# Patient Record
Sex: Female | Born: 1950 | Hispanic: No
Health system: Midwestern US, Academic
[De-identification: ages and names within clinical notes are randomized; demographics above are authoritative.]

## PROBLEM LIST (undated history)

## (undated) DIAGNOSIS — R1110 Vomiting, unspecified: Secondary | ICD-10-CM

## (undated) DIAGNOSIS — E119 Type 2 diabetes mellitus without complications: Secondary | ICD-10-CM

## (undated) DIAGNOSIS — M129 Arthropathy, unspecified: Secondary | ICD-10-CM

## (undated) DIAGNOSIS — I10 Essential (primary) hypertension: Secondary | ICD-10-CM

## (undated) DIAGNOSIS — IMO0001 Reserved for inherently not codable concepts without codable children: Secondary | ICD-10-CM

## (undated) DIAGNOSIS — L259 Unspecified contact dermatitis, unspecified cause: Secondary | ICD-10-CM

## (undated) DIAGNOSIS — Z973 Presence of spectacles and contact lenses: Secondary | ICD-10-CM

## (undated) DIAGNOSIS — M1990 Unspecified osteoarthritis, unspecified site: Secondary | ICD-10-CM

## (undated) DIAGNOSIS — I2510 Atherosclerotic heart disease of native coronary artery without angina pectoris: Secondary | ICD-10-CM

## (undated) HISTORY — PX: HX HYSTERECTOMY: SHX81

## (undated) HISTORY — PX: HX TONSILLECTOMY: SHX27

## (undated) HISTORY — PX: HX CARPAL TUNNEL RELEASE: SHX101

## (undated) HISTORY — PX: HX KNEE REPLACMENT: SHX125

## (undated) HISTORY — PX: HX HERNIA REPAIR: SHX51

## (undated) HISTORY — PX: CORONARY ARTERY ANGIOPLASTY: PR CATH30428

## (undated) HISTORY — PX: HX FOOT SURGERY: 2100001154

## (undated) HISTORY — PX: HX BACK SURGERY: SHX140

## (undated) HISTORY — PX: HX SHOULDER ARTHROSCOPY: SHX128

## (undated) HISTORY — PX: ABDOMINAL HYSTERECTOMY: SHX81

## (undated) HISTORY — PX: HERNIA REPAIR: SHX51

## (undated) HISTORY — PX: JOINT REPLACEMENT: SHX530

## (undated) HISTORY — PX: CARDIAC SURGERY: SHX584

---

## 1998-09-27 HISTORY — PX: CARPAL TUNNEL RELEASE: SHX101

## 1998-09-27 HISTORY — PX: REPLACEMENT TOTAL KNEE BILATERAL: SUR1225

## 2014-03-07 ENCOUNTER — Emergency Department (HOSPITAL_BASED_OUTPATIENT_CLINIC_OR_DEPARTMENT_OTHER)
Admission: EM | Admit: 2014-03-07 | Discharge: 2014-03-07 | Disposition: A | Discharge status: Home / Self Care | Payer: Self-pay | Attending: Emergency Medicine | Admitting: Emergency Medicine

## 2014-03-07 ENCOUNTER — Encounter (HOSPITAL_BASED_OUTPATIENT_CLINIC_OR_DEPARTMENT_OTHER): Payer: Self-pay

## 2014-03-07 DIAGNOSIS — E119 Type 2 diabetes mellitus without complications: Secondary | ICD-10-CM | POA: Insufficient documentation

## 2014-03-07 DIAGNOSIS — M5430 Sciatica, unspecified side: Secondary | ICD-10-CM | POA: Insufficient documentation

## 2014-03-07 DIAGNOSIS — I10 Essential (primary) hypertension: Secondary | ICD-10-CM | POA: Insufficient documentation

## 2014-03-07 DIAGNOSIS — G609 Hereditary and idiopathic neuropathy, unspecified: Secondary | ICD-10-CM | POA: Insufficient documentation

## 2014-03-07 HISTORY — DX: Type 2 diabetes mellitus without complications (CMS HCC): E11.9

## 2014-03-07 HISTORY — DX: Reserved for inherently not codable concepts without codable children: IMO0001

## 2014-03-07 HISTORY — DX: Essential (primary) hypertension: I10

## 2014-03-07 HISTORY — DX: Unspecified contact dermatitis, unspecified cause: L25.9

## 2014-03-07 HISTORY — DX: Presence of spectacles and contact lenses: Z97.3

## 2014-03-07 HISTORY — DX: Arthropathy, unspecified: M12.9

## 2014-03-07 MED ORDER — PREDNISONE 10 MG TABLET
ORAL_TABLET | ORAL | Status: AC
Start: 2014-03-07 — End: ?

## 2014-03-07 NOTE — ED Nurses Note (Signed)
Patient started with right hip & sciatica pain x 2 days ago. C/O numbness to right leg. Denies injury. History of back surgery x 10 years ago

## 2014-03-07 NOTE — ED Provider Notes (Signed)
Susan Sloan, P.A.  [facility]  Emergency Department Visit Note    Date: 03/07/2014  Primary care provider: None Given  Means of arrival: private car  History obtained by: patient  History limited by: none      Chief Complaint: sciatica    History of Present Illness     Susan Sloan, date of birth 09-Oct-1950, is a 63 y.o. female who presents to the Emergency Department complaining of low back pain.  The patient reports left hip and low back pain which has been going on for several months.  The patient states that she did sustain a fall approximately 3 months ago and since that time she has had right-sided low back pain that radiates across her buttock to the posterior aspect of her right knee.  The patient states that in that timeframe, she received a cortisone injection which temporarily helped her symptoms but now the symptoms have returned.  The patient states that she has had periodic numbness in the right thigh and leg.  This also has been a persistent symptom for many months.  The patient states she did have low back surgery that was over 10 years ago.  At that point in time, she states she had a" disk" problem.  The patient states that while she is a diabetic, that she monitors her glucose closely and states it seldom goes above 110 mg /dl.  The patient states that she has been taking Mobic alternating with ibuprofen for her hip pain; it is not helping at this point in time.  The patient did drive herself here.  The patient is not reporting any additional new onset symptoms.  There are no reports of any fever, no reports of any recent trauma.  No reports of her leg actually giving out and no reports of any increased extremity swelling.  In addition, she denies any increased thirst, hunger or urination.  The patient states that she has received steroid therapy in the past that has improved her condition and she is requesting that at this point in time.  The patient states she is well aware of the need  to monitor her blood glucose.    REMAINING REVIEW OF SYSTEMS:  No additional positive findings other than those listed in history of present illness, otherwise all other remaining review of systems have been discussed and are negative.  ROS: NO fever,chest pain , palpitations, dyspnea, abdominal pain, perceptible change in bowel or bladder pattern, no incontinence, no increased extremity swelling.      Context:  As Per HPI  Pertinent Past Medical History:  Reviewed  Onset:  As per HPI  Timing:  As per HPI  Location/Radiation: low back pain;radiating to right hip & leg   Quality:  As per HPI  Severity: 10   Modifying Factors:  As per HPI  Associated Symptoms:   Positive:  As per HPI  Negative:  As per HPI    Review of Systems     The pertinent positive and negative symptoms are as per HPI. All other systems reviewed and are negative.    Patient History      Past Medical History:  Past Medical History   Diagnosis Date   . Arthropathy, unspecified, site unspecified    . Diabetes mellitus    . Reflux    . HTN (hypertension)    . Contact dermatitis and other eczema, due to unspecified cause    . Wears glasses  Past Surgical History:  Past Surgical History   Procedure Laterality Date   . Coronary artery angioplasty     . Hx back surgery     . Hx knee replacment Bilateral    . Hx hysterectomy     . Hx carpal tunnel release     . Hx shoulder arthroscopy     . Hx hernia repair     . Hx cesarean section     . Hx tonsillectomy     . Hx foot surgery Left        Family History:  No family history on file.    Social History:  History   Substance Use Topics   . Smoking status: Never Smoker    . Smokeless tobacco: Not on file   . Alcohol Use: No     History   Drug Use No       Medications:  Previous Medications    GLIMEPIRIDE (AMARYL) 4 MG ORAL TABLET    Take 4 mg by mouth Every morning with breakfast    IBUPROFEN (MOTRIN) 800 MG ORAL TABLET    Take 800 mg by mouth Twice daily    ISOSORBIDE DINITRATE (ISORDIL) 30 MG ORAL  TABLET    Take 30 mg by mouth Three times a day    METFORMIN (GLUCOPHAGE) 1,000 MG ORAL TABLET    Take 1,000 mg by mouth Twice daily with food    METOPROLOL (LOPRESSOR) 50 MG ORAL TABLET    Take 50 mg by mouth Twice daily       Allergies:   Allergies   Allergen Reactions   . Codeine    . Morphine    . Penicillins    . Sulfa (Sulfonamides)        Physical Exam   PHYSICAL EXAMINATION: The patient is a markedly overnight elderly woman with very poor hygiene.  She is alert.  She is sitting on a stretcher reading a book and does not appear to be in any acute distress.    HEENT:  Head is normocephalic and atraumatic.  Eyes, conjunctivae pink, sclerae anicteric.  Pupils react to light.  Ears, tympanic membranes without hyperemia, no bulging or retraction.  Oral cavity, mucous membranes remain moist.  Posterior pharynx is without injection or asymmetry.    NECK:  Supple, no adenopathy, no nuchal rigidity.    HEART:  Regular rate and rhythm without murmur.    LUNGS:  No wheezing, no tachypnea.    ABDOMEN:  Obese, soft, nontender, difficult to assess for organomegaly secondary to body habitus.    EXTREMITIES:  Grips are symmetrical in the upper extremities.  She has full range of motion of the upper extremities with flexion, extension and abduction at the shoulders, elbows and wrists against resistance.  Lower extremities, proprioception of the great toes is equivocal.  Sometimes she is uncertain of the position great toe during assessment. When toe is placed in plantar flexion with her eyes closed she cannot determine the toes position on a consistent basis.  She did report gross sensation being intact and sharp sensation being intact.  The patient has symmetrical peripheral pulses.  Lower extremities with no pitting edema, no gastrocnemius pain.    The patient declined crutches.  The patient is declining x-rays.  The patient states she has a scheduled appointment with her regular provider within the month.  I did tell her  that it was imperative that she be reassessed regarding the findings suggestive of peripheral neuropathy.  She  should be very strict about monitoring her blood glucose and if it goes above 230, she should stop the steroids.  The patient verbally agrees to do so.    CONDITION UPON DISCHARGE:  While unchanged is considered stable.      Please note that the patient's posterior thorax, she has point tenderness over the sacroiliac joint on the right.  No skin lesions in the area and she has some point tenderness over the trochanter of the right hip.  No skin lesions in the area.  She has no tenderness directly over the spinous processes of the C-spine and sacrum and there is no costovertebral angle tenderness.        Vital Signs:  Filed Vitals:    03/07/14 0754   BP: 143/61   Pulse: 70   Resp: 16   SpO2: 98%              Diagnostics                Old records reviewed by me:        Pre-Disposition Vitals:  Filed Vitals:    03/07/14 0754   BP: 143/61   Pulse: 70   Resp: 16   SpO2: 98%       Clinical Impression    1.Acute Sciatica:Right  2. Peripheral Neuropathy  3. Diabetes Type II    Plan/Disposition     Discharged    Prescriptions:  New Prescriptions    PREDNISONE (DELTASONE) 10 MG ORAL TABLET    Disp # 15 tabs  Sig : 50 mg day one tapered to 10 mg by day five (take this with food)       Follow Up:  Given, None  [address]  [facility] [place] [id number]          Casimiro NeedleMorris, Samuel David, MD  [address]  [place] [id number]  [phone]    Schedule an appointment as soon as possible for a visit in 2 days          Condition on Disposition: stable

## 2014-03-07 NOTE — ED Nurses Note (Signed)
Patient receives cortisone shots routinely to right hip. Patient from [place], visiting daughter.

## 2014-03-07 NOTE — ED Nurses Note (Signed)
Denise PA in room at present

## 2014-03-07 NOTE — ED Nurses Note (Signed)
Patient discharged home with family.  AVS reviewed with patient/care giver.  A written copy of the AVS and discharge instructions was given to the patient.  Questions sufficiently answered as needed.  Patient encouraged to follow up with PCP as indicated.  In the event of an emergency, patient instructed to call 911 or go to the nearest emergency room. Prescription for Prednisone given, patient verbalized understanding

## 2014-05-18 ENCOUNTER — Emergency Department (HOSPITAL_COMMUNITY)
Admission: EM | Admit: 2014-05-18 | Discharge: 2014-05-19 | Disposition: A | Discharge status: Home / Self Care | Payer: Self-pay | Attending: Emergency Medicine | Admitting: Emergency Medicine

## 2014-05-18 ENCOUNTER — Encounter (HOSPITAL_COMMUNITY): Payer: Self-pay | Admitting: Emergency Medicine

## 2014-05-18 ENCOUNTER — Emergency Department (HOSPITAL_COMMUNITY): Payer: Self-pay

## 2014-05-18 DIAGNOSIS — M5431 Sciatica, right side: Secondary | ICD-10-CM

## 2014-05-18 DIAGNOSIS — M545 Low back pain, unspecified: Secondary | ICD-10-CM | POA: Insufficient documentation

## 2014-05-18 DIAGNOSIS — M1610 Unilateral primary osteoarthritis, unspecified hip: Secondary | ICD-10-CM | POA: Insufficient documentation

## 2014-05-18 DIAGNOSIS — I2510 Atherosclerotic heart disease of native coronary artery without angina pectoris: Secondary | ICD-10-CM | POA: Insufficient documentation

## 2014-05-18 DIAGNOSIS — Z9889 Other specified postprocedural states: Secondary | ICD-10-CM | POA: Insufficient documentation

## 2014-05-18 DIAGNOSIS — Z96659 Presence of unspecified artificial knee joint: Secondary | ICD-10-CM | POA: Insufficient documentation

## 2014-05-18 DIAGNOSIS — M79609 Pain in unspecified limb: Secondary | ICD-10-CM | POA: Insufficient documentation

## 2014-05-18 DIAGNOSIS — M25559 Pain in unspecified hip: Secondary | ICD-10-CM | POA: Insufficient documentation

## 2014-05-18 DIAGNOSIS — Z880 Allergy status to penicillin: Secondary | ICD-10-CM | POA: Insufficient documentation

## 2014-05-18 DIAGNOSIS — M1611 Unilateral primary osteoarthritis, right hip: Secondary | ICD-10-CM

## 2014-05-18 DIAGNOSIS — E119 Type 2 diabetes mellitus without complications: Secondary | ICD-10-CM | POA: Insufficient documentation

## 2014-05-18 HISTORY — DX: Atherosclerotic heart disease of native coronary artery without angina pectoris: I25.10

## 2014-05-18 HISTORY — DX: Unspecified osteoarthritis, unspecified site: M19.90

## 2014-05-18 HISTORY — DX: Type 2 diabetes mellitus without complications: E11.9

## 2014-05-18 LAB — CBC WITH DIFFERENTIAL/PLATELET
BASOS ABS: 0 10*3/uL (ref 0.0–0.1)
BASOS PCT: 0 % (ref 0–1)
Eosinophils Absolute: 0.2 10*3/uL (ref 0.0–0.7)
Eosinophils Relative: 2 % (ref 0–5)
HEMATOCRIT: 38.3 % (ref 36.0–46.0)
HEMOGLOBIN: 12.4 g/dL (ref 12.0–15.0)
Lymphocytes Relative: 24 % (ref 12–46)
Lymphs Abs: 2.8 10*3/uL (ref 0.7–4.0)
MCH: 28.3 pg (ref 26.0–34.0)
MCHC: 32.4 g/dL (ref 30.0–36.0)
MCV: 87.4 fL (ref 78.0–100.0)
MONO ABS: 0.6 10*3/uL (ref 0.1–1.0)
Monocytes Relative: 5 % (ref 3–12)
NEUTROS ABS: 7.9 10*3/uL — AB (ref 1.7–7.7)
Neutrophils Relative %: 69 % (ref 43–77)
Platelets: 249 10*3/uL (ref 150–400)
RBC: 4.38 MIL/uL (ref 3.87–5.11)
RDW: 14.4 % (ref 11.5–15.5)
WBC: 11.6 10*3/uL — ABNORMAL HIGH (ref 4.0–10.5)

## 2014-05-18 LAB — I-STAT CHEM 8, ED
BUN: 14 mg/dL (ref 6–23)
Calcium, Ion: 1.14 mmol/L (ref 1.13–1.30)
Chloride: 103 mEq/L (ref 96–112)
Creatinine, Ser: 1.2 mg/dL — ABNORMAL HIGH (ref 0.50–1.10)
GLUCOSE: 175 mg/dL — AB (ref 70–99)
HEMATOCRIT: 40 % (ref 36.0–46.0)
Hemoglobin: 13.6 g/dL (ref 12.0–15.0)
Potassium: 4.4 mEq/L (ref 3.7–5.3)
Sodium: 133 mEq/L — ABNORMAL LOW (ref 137–147)
TCO2: 30 mmol/L (ref 0–100)

## 2014-05-18 MED ORDER — TRAMADOL HCL 50 MG PO TABS
50.0000 mg | ORAL_TABLET | Freq: Once | ORAL | Status: AC
Start: 2014-05-18 — End: 2014-05-19
  Administered 2014-05-19: 50 mg via ORAL
  Filled 2014-05-18: qty 1

## 2014-05-18 MED ORDER — PREDNISONE 20 MG PO TABS
60.0000 mg | ORAL_TABLET | Freq: Once | ORAL | Status: AC
  Administered 2014-05-19: 60 mg via ORAL
  Filled 2014-05-18: qty 3

## 2014-05-18 NOTE — ED Provider Notes (Signed)
CSN: [id number]     Arrival date & time 05/18/14  2016 History   First MD Initiated Contact with Patient 05/18/14 2326     Chief Complaint  Patient presents with  . Hip Pain  . Leg Pain     (Consider location/radiation/quality/duration/timing/severity/associated sxs/prior Treatment) HPI Elderly obese woman with history of lumbar laminectomy and sciatica on the right side. She comes in with complaints of pain in the right hip and knee. She has had this pain for several months. She says that pain is getting worse over the past week.  A relative told her that she needed to come to the ED to get checked out. She sometimes experiences numbness in the right thigh when she lays for a long time against her right hip.   Patient does not have an orthopedist or a PCP. 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    Past Medical History  Diagnosis Date  . Diabetes mellitus without complication   . Coronary artery disease   . Arthritis    Past Surgical History  Procedure Laterality Date  . Cardiac surgery      triple bypass  . Abdominal hysterectomy    . Replacement total knee bilateral  2000  . Carpal tunnel release  2000  . Hernia repair    . Joint replacement    . Cesarean section     History reviewed. No pertinent family history. History  Substance Use Topics  . Smoking status: Never Smoker   . Smokeless tobacco: Not on file  . Alcohol Use: No   OB History   Grav Para Term Preterm Abortions TAB SAB Ect Mult Living                 Review of Systems  10 point review of symptoms obtained and is negative with the exceptions of symptoms noted abov.e   Allergies  Betadine; Codeine; Demerol; Morphine and related; Penicillins; Sulfa antibiotics; and Tape  Home Medications   Prior to Admission medications   Not on File   BP 119/73  Pulse 95  Temp(Src) 98 F (36.7 C) (Oral)  Resp 18  SpO2 100% Physical  Exam  Gen: well nourished and well developed appearing Head: NCAT Ears: normal to inspection Nose: normal to inspection, no epistaxis or drainage Mouth: oral mucsoa is well hydrated appearing, normal posterior oropharynx Neck: supple, no stridor CV: RRR, no murmur, palpable peripheral pulses Resp: lung sounds are clear to auscultation bilaterally, no wheeing or rhonchi or rales, normal respiratory effort.  Abd: soft, nontender, nondistended, obese,  Extremities: ttp over the right hip with normal passive ROM, well healed incision over the right knee with passive ROM in place, no ttp over the remainder of the leg. DP pulses symmetric, sensation intact to light touch throughout, brisk and symmetric patellar and achilles tendon reflexes.Skin: warm and dry Neuro: CN ii - XII, no focal deficitis Psyche; normal affect, cooperative.   ED Course  Procedures (including critical care time) Labs Review  Results for orders placed during the hospital encounter of 05/18/14 (from the past 24 hour(s))  CBC WITH DIFFERENTIAL     Status: Abnormal   Collection Time    05/18/14 10:01 PM      Result Value Ref Range   WBC 11.6 (*) 4.0 - 10.5 K/uL   RBC 4.38  3.87 - 5.11 MIL/uL   Hemoglobin 12.4  12.0 - 15.0  g/dL   HCT 16.1  09.6 - 04.5 %   MCV 87.4  78.0 - 100.0 fL   MCH 28.3  26.0 - 34.0 pg   MCHC 32.4  30.0 - 36.0 g/dL   RDW 40.9  81.1 - 91.4 %   Platelets 249  150 - 400 K/uL   Neutrophils Relative % 69  43 - 77 %   Neutro Abs 7.9 (*) 1.7 - 7.7 K/uL   Lymphocytes Relative 24  12 - 46 %   Lymphs Abs 2.8  0.7 - 4.0 K/uL   Monocytes Relative 5  3 - 12 %   Monocytes Absolute 0.6  0.1 - 1.0 K/uL   Eosinophils Relative 2  0 - 5 %   Eosinophils Absolute 0.2  0.0 - 0.7 K/uL   Basophils Relative 0  0 - 1 %   Basophils Absolute 0.0  0.0 - 0.1 K/uL  I-STAT CHEM 8, ED     Status: Abnormal   Collection Time    05/18/14 10:13 PM      Result Value Ref Range   Sodium 133 (*) 137 - 147 mEq/L   Potassium  4.4  3.7 - 5.3 mEq/L   Chloride 103  96 - 112 mEq/L   BUN 14  6 - 23 mg/dL   Creatinine, Ser 7.82 (*) 0.50 - 1.10 mg/dL   Glucose, Bld 956 (*) 70 - 99 mg/dL   Calcium, Ion 2.13  0.86 - 1.30 mmol/L   TCO2 30  0 - 100 mmol/L   Hemoglobin 13.6  12.0 - 15.0 g/dL   HCT 57.8  46.9 - 62.9 %   DG Hip Complete Right (Final result)  Result time: 05/18/14 23:54:00    Final result by Rad Results In Interface (05/18/14 23:54:00)    Narrative:   CLINICAL DATA: 63 year old female right hip pain, leg pain. Initial encounter.  EXAM: RIGHT HIP - COMPLETE 2+ VIEW  COMPARISON: None.  FINDINGS: Right hip joint space loss and subchondral sclerosis. Both femoral heads remain normally located. Bony pelvis intact. Sacrum and SI joints within normal limits. Lower lumbar disc space loss and vacuum disc phenomena.  Proximal right femur intact.  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 No acute fracture or dislocation identified about the right hip or pelvis.   Electronically Signed By: Augusto Gamble M.D. On: 05/18/2014 23:54             DG Knee 1-2 Views Right (Final result)  Result time: 05/18/14 23:55:54    Procedure changed from [facility] Knee Complete 4 Views Right       Final result by Rad Results In Interface (05/18/14 23:55:54)    Narrative:   CLINICAL DATA: Knee pain.  EXAM: RIGHT KNEE - 1-2 VIEW  COMPARISON: None.  FINDINGS: Total knee arthroplasty. No acute fracture or hardware displacement. There is a lucency around the tibial component, up to 3 mm. This finding is likely postsurgical but indeterminate without comparison. No definitive joint effusion.  IMPRESSION: 1. No acute osseous findings. 2. Total knee arthroplasty with lucency around the tibial component. Recommend orthopedic follow-up for correlation with previous studies to exclude chronic loosening.   Electronically Signed By: Tiburcio Pea M.D. On: 05/18/2014 23:55          MDM    Patient with symptoms of sciatica and history of same. Reports improvement in sciatica, previously with steroid burst and requests same. First dose of prednisone given in the ED. Patient informed of xray findings including severe DJD  of the right hip which is her likely cause of right hip pain. She [place] her dtr. Home on Tramadol and Tylenol.    Brandt LoosenJulie Manly, MD 05/19/14 [phone]

## 2014-05-18 NOTE — ED Notes (Signed)
Patient states she was walking and she states that her right leg nearly gave out and she almost fell.  She does walk with a cane.  Patient states she has had trouble with the right hip lately going numb.  Patient states she does have a headache also.  Patient is CAOx3.  Patient states she also has a rash on her arms and legs that itch.

## 2014-05-19 MED ORDER — METHYLPREDNISOLONE 4 MG PO KIT: PACK | ORAL | Status: AC

## 2014-05-19 MED ORDER — TRAMADOL HCL 50 MG PO TABS: 50.0000 mg | ORAL_TABLET | Freq: Four times a day (QID) | ORAL | Status: AC | PRN

## 2014-05-19 NOTE — Discharge Instructions (Signed)
Arthritis, Nonspecific °Arthritis is pain, redness, warmth, or puffiness (inflammation) of a joint. The joint may be stiff or hurt when you move it. One or more joints may be affected. There are many types of arthritis. Your doctor may not know what type you have right away. The most common cause of arthritis is wear and tear on the joint (osteoarthritis). °HOME CARE  °· Only take medicine as told by your doctor. °· Rest the joint as much as possible. °· Raise (elevate) your joint if it is puffy. °· Use crutches if the painful joint is in your leg. °· Drink enough fluids to keep your pee (urine) clear or pale yellow. °· Follow your doctor's diet instructions. °· Use cold packs for very bad joint pain for 10 to 15 minutes every hour. Ask your doctor if it is okay for you to use hot packs. °· Exercise as told by your doctor. °· Take a warm shower if you have stiffness in the morning. °· Move your sore joints throughout the day. °GET HELP RIGHT AWAY IF:  °· You have a fever. °· You have very bad joint pain, puffiness, or redness. °· You have many joints that are painful and puffy. °· You are not getting better with treatment. °· You have very bad back pain or leg weakness. °· You cannot control when you poop (bowel movement) or pee (urinate). °· You do not feel better in 24 hours or are getting worse. °· You are having side effects from your medicine. °MAKE SURE YOU:  °· Understand these instructions. °· Will watch your condition. °· Will get help right away if you are not doing well or get worse. °Document Released: 12/08/2009 Document Revised: 03/14/2012 Document Reviewed: 12/08/2009 °Exit[employer]® Patient Information ©2015 [employer]. This information is not intended to replace advice given to you by your health care provider. Make sure you discuss any questions you have with your health care provider. ° °

## 2015-05-15 IMAGING — CR DG HIP COMPLETE 2+V*R*
3 series · 3 of 3 positions shown · non-contrast
Comparison: None.

CLINICAL DATA: 63-year-old female right hip pain, leg pain. Initial
encounter.

EXAM:
RIGHT HIP - COMPLETE 2+ VIEW

[t pelvis ap]
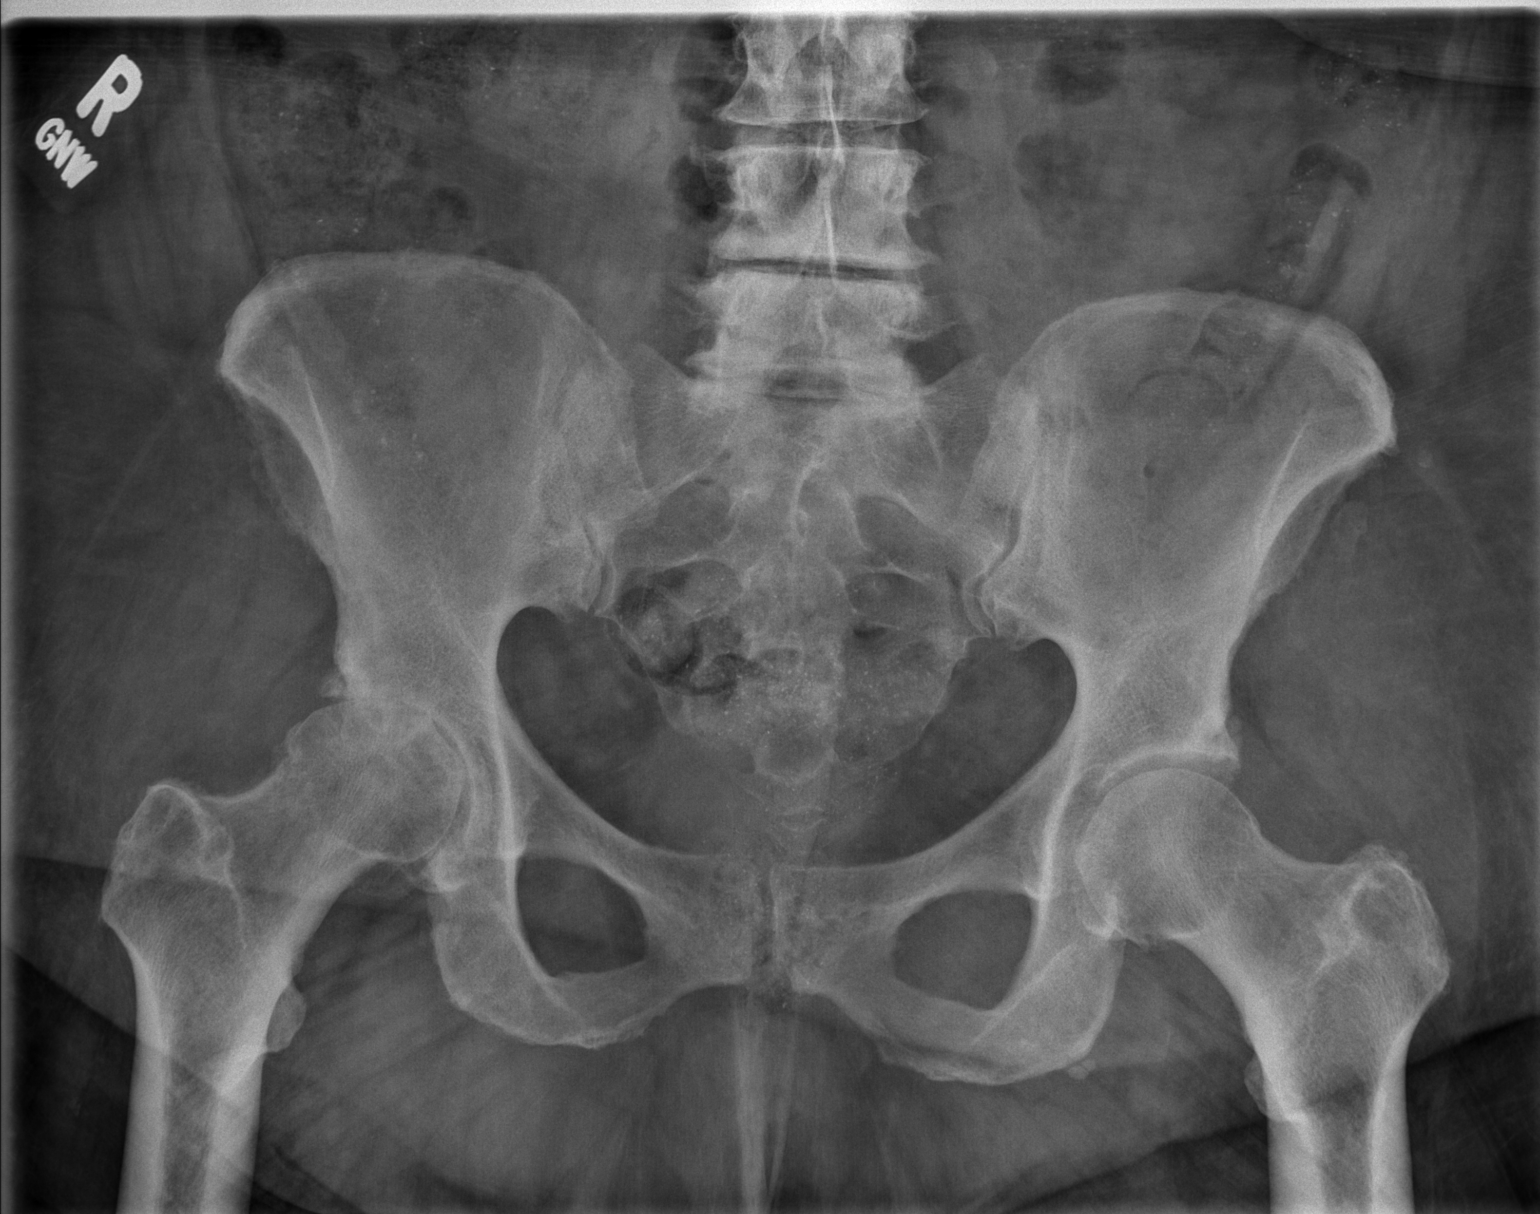

[t hip ap right]
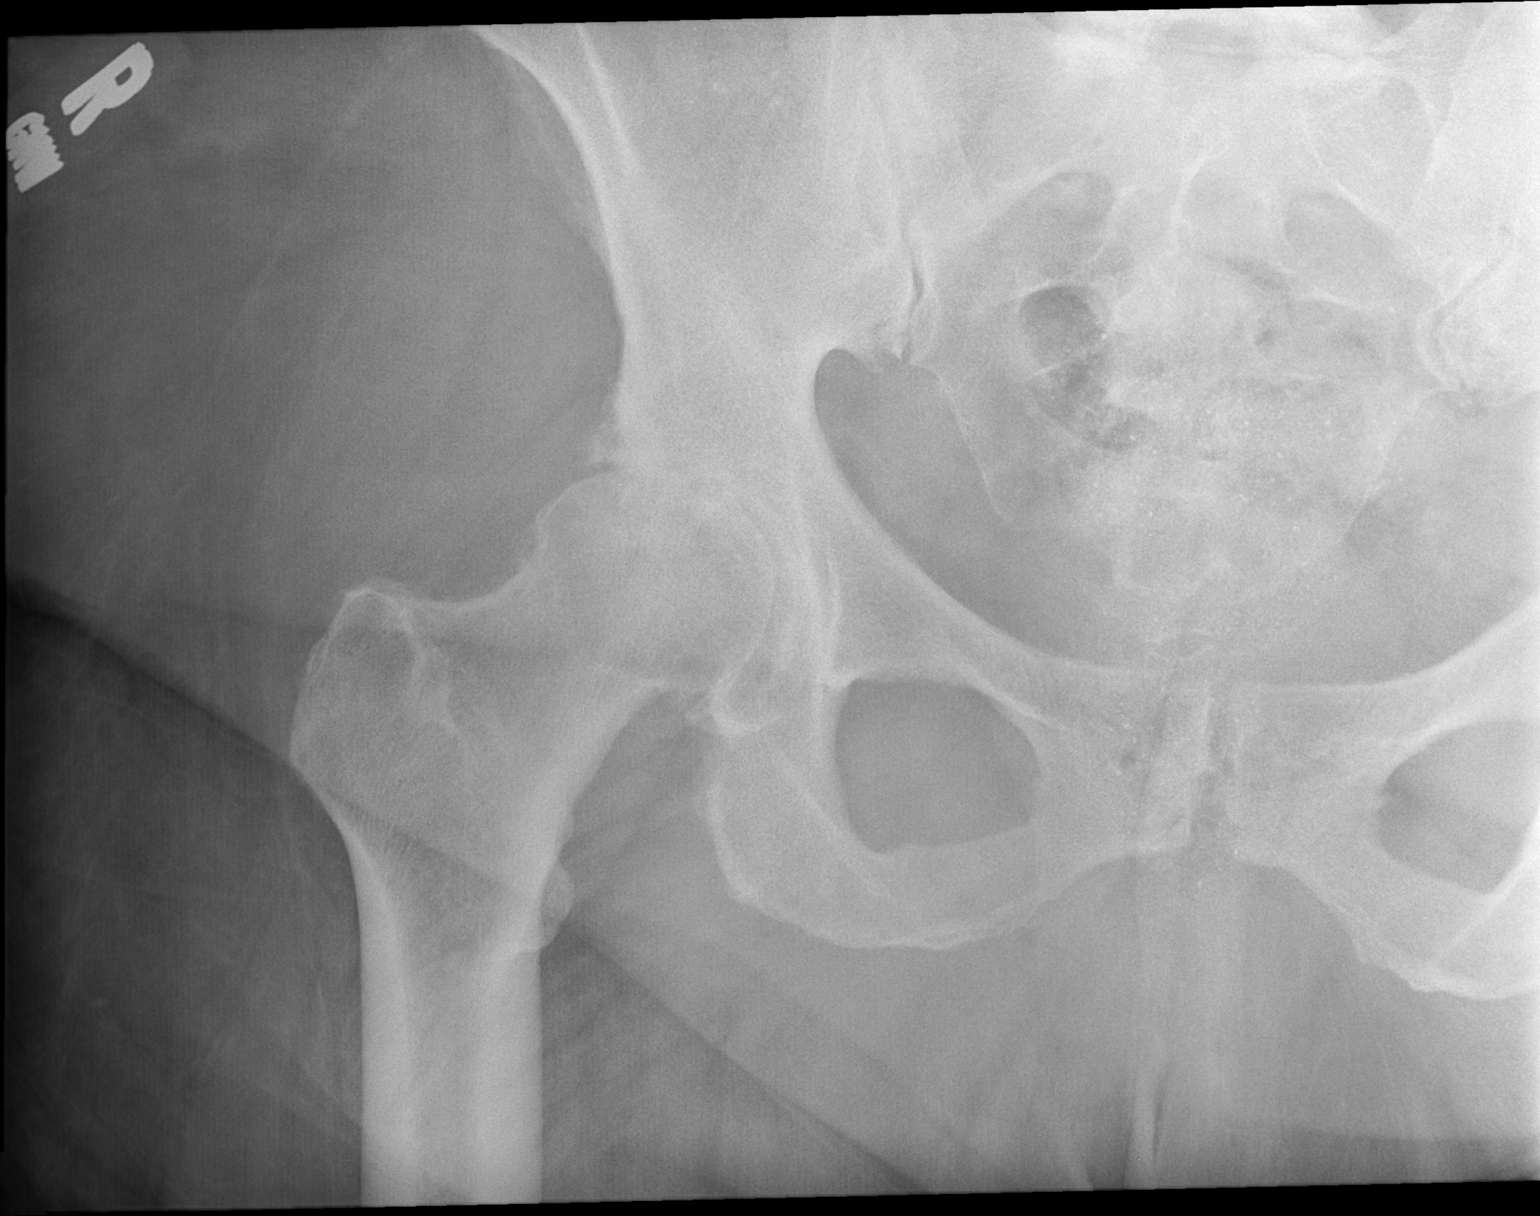

[t hip frog leg right]
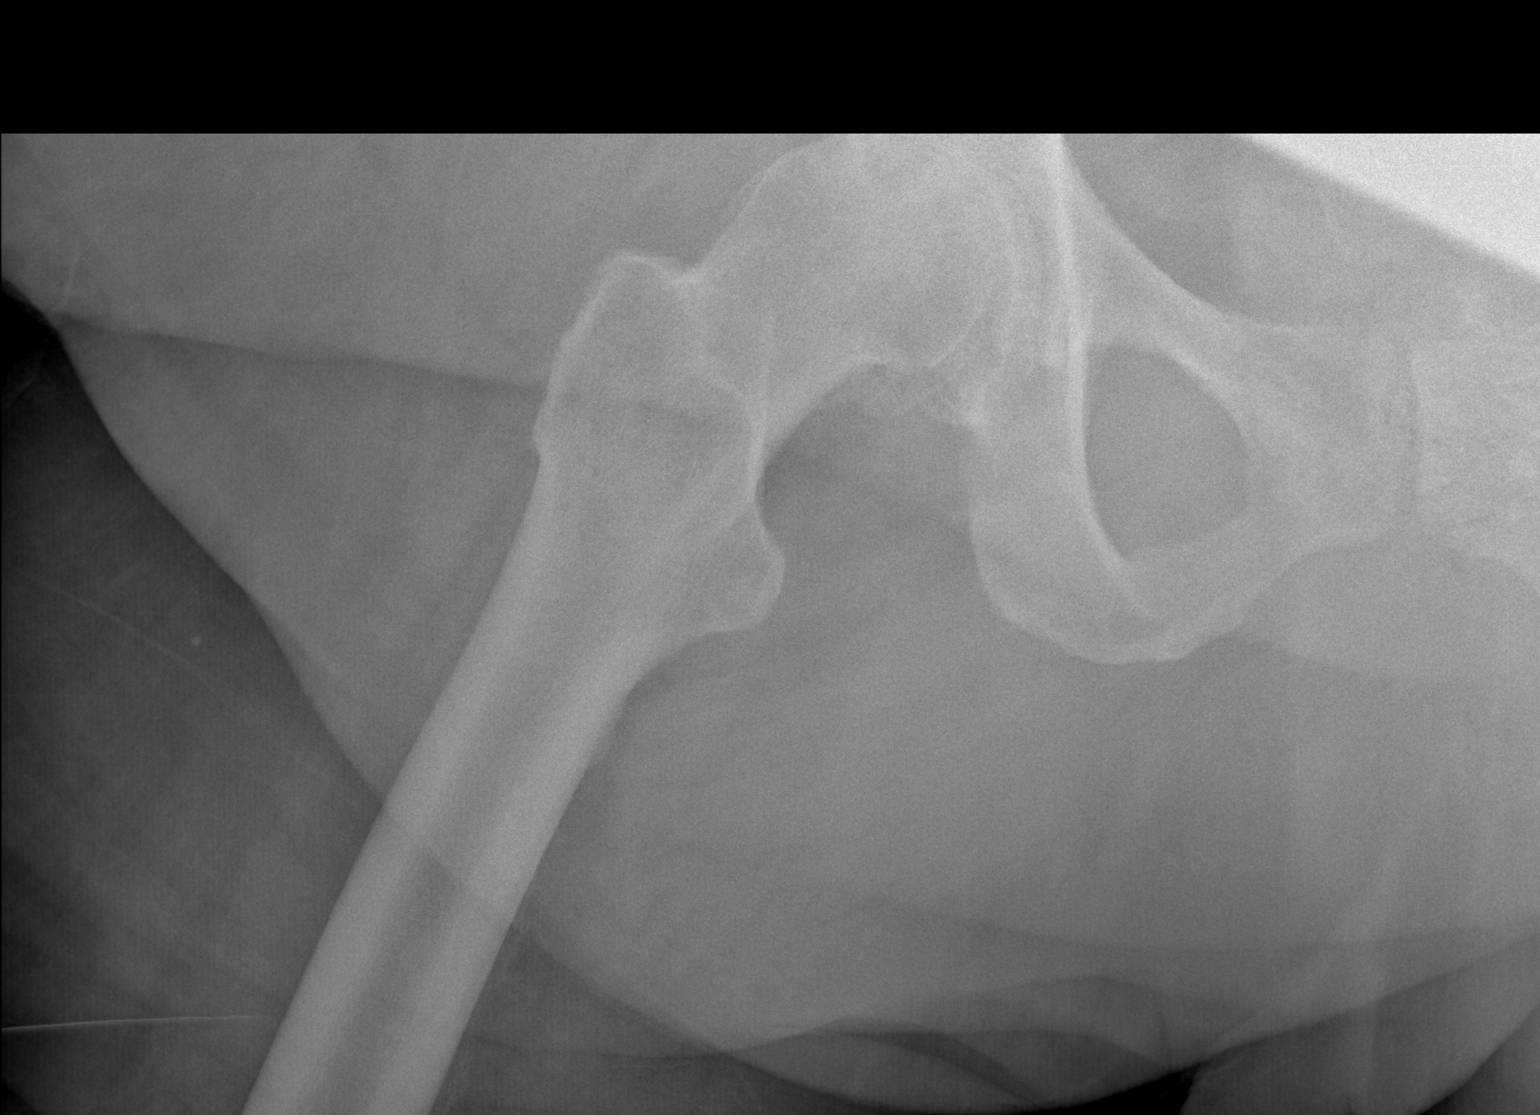

[3 of 3 positions shown; findings below may reference images not displayed]

FINDINGS: Right hip joint space loss and subchondral sclerosis. Both femoral
heads remain normally located. Bony pelvis intact. Sacrum and SI
joints within normal limits. Lower lumbar disc space loss and vacuum
disc phenomena.

Proximal right femur intact.
IMPRESSION: Moderate to severe right hip osteoarthritis. No acute fracture or
dislocation identified about the right hip or pelvis.

## 2015-05-15 IMAGING — CR DG KNEE 1-2V*R*
2 series · 2 of 2 positions shown · non-contrast
Comparison: None.

CLINICAL DATA: Knee pain.

EXAM:
RIGHT KNEE - 1-2 VIEW

[t knee ap right]
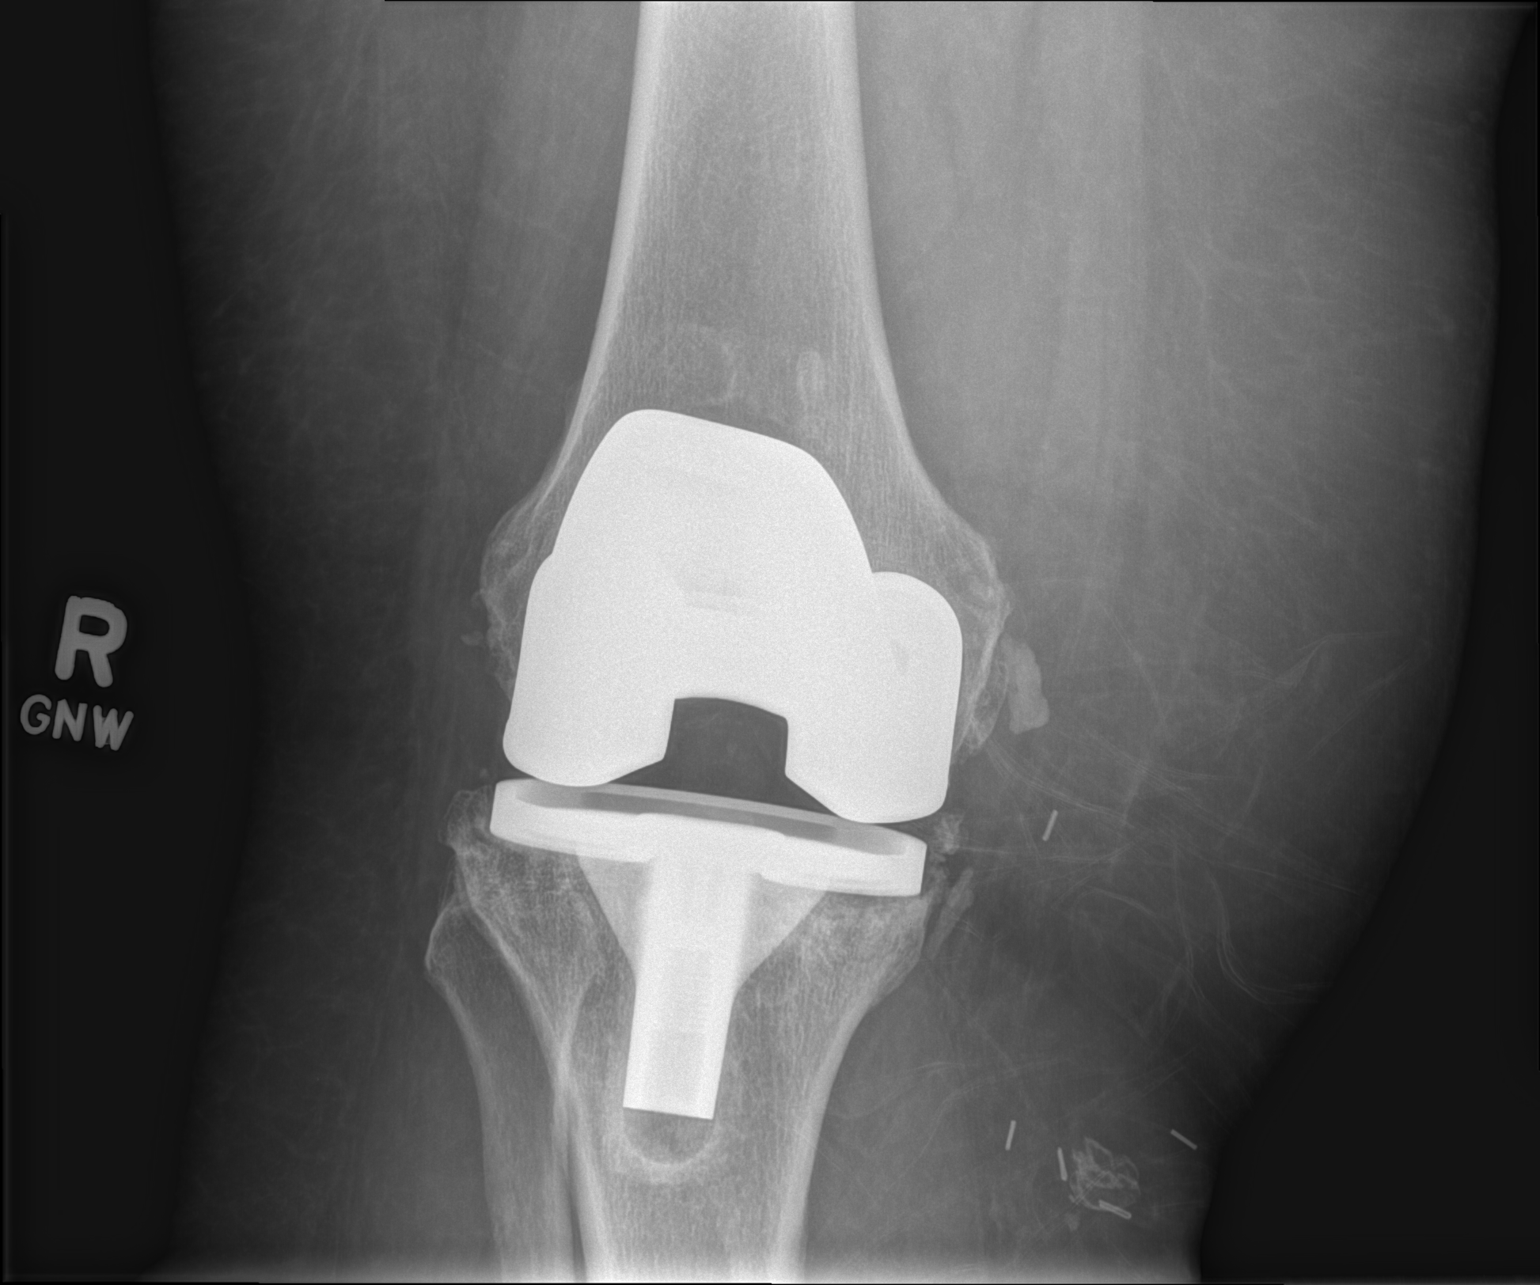

[t knee lat right]
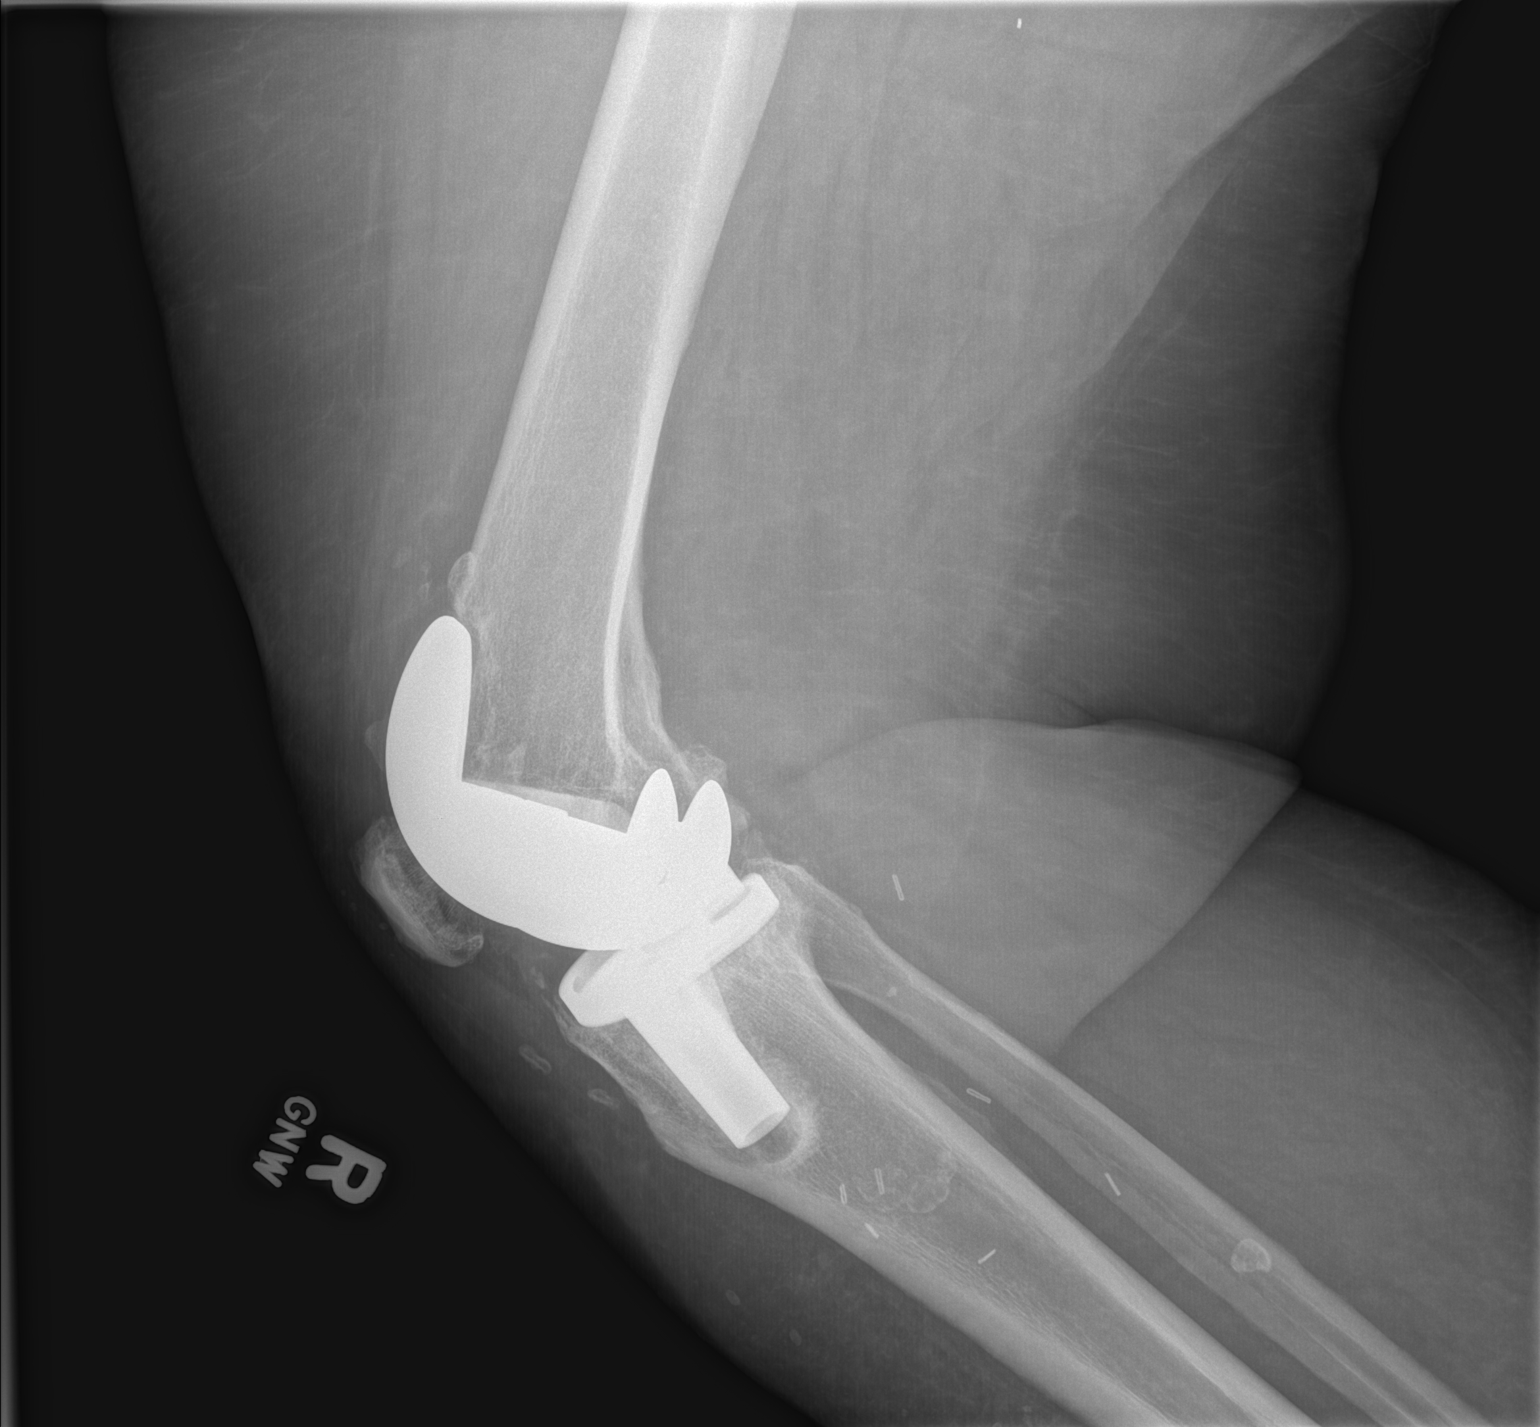

[2 of 2 positions shown; findings below may reference images not displayed]

FINDINGS: Total knee arthroplasty. No acute fracture or hardware displacement.
There is a lucency around the tibial component, up to 3 mm. This
finding is likely postsurgical but indeterminate without comparison.
No definitive joint effusion.
IMPRESSION: 1. No acute osseous findings.
2. Total knee arthroplasty with lucency around the tibial component.
Recommend orthopedic follow-up for correlation with previous studies
to exclude chronic loosening.

## 2015-12-19 ENCOUNTER — Inpatient Hospital Stay: Admit: 2015-12-19 | Attending: Internal Medicine

## 2015-12-19 LAB — POCT GLUCOSE
POC Glucose: 78 mg/dl (ref 70–99)
POC Glucose: 74 mg/dl (ref 70–99)

## 2015-12-19 MED ORDER — MIDAZOLAM HCL 5 MG/5ML IJ SOLN
5 | INTRAMUSCULAR | Status: AC
Start: 2015-12-19 — End: 2015-12-19

## 2015-12-19 MED ORDER — MEPERIDINE HCL 50 MG/ML IJ SOLN
50 | INTRAMUSCULAR | Status: AC
Start: 2015-12-19 — End: 2015-12-19

## 2015-12-19 MED FILL — DEMEROL 50 MG/ML IJ SOLN: 50 MG/ML | INTRAMUSCULAR | Qty: 1

## 2015-12-19 MED FILL — MIDAZOLAM HCL 5 MG/5ML IJ SOLN: 5 MG/ML | INTRAMUSCULAR | Qty: 10

## 2015-12-19 NOTE — Plan of Care (Addendum)
ADMISSION PRE-PROCEDURE INTRA-PROCEDURE POST-PROCEDURE: RECOVERY/ DISCHARGE   ASSESSMENT &  EVALUATION CONSULTS [x] Verify patient identification, allergies, vital signs, NPO, IV, & SPO2  [x] Complete  the ALDRETE SCORE  [x] Consent form to treat signed  [x] History and Physical [x] Reassess patient after pre- procedure medication given  [x] GI physician evaluates pt  [x] Verify patient's name, allergies [x] Continuous monotoring of vital  signs, SPO2, LOC  [x] Emotional status  [x] Patient comfort level [x] Total system admission assessment with appropriate intervention  [x] Pain evaluation and management  [x] Discharge assessment with appropriate intervention  [x] Compare with pre-procedure status  [x] Discharge by appropriate physician   DIAGNOSTIC / TESTS [x] Lab work ordered  [x] Obtain and attach lab work to patient's chart  [x] Report abnormals and F/U with physician [x] Assure needed test results are present [x] Diagnostic/therapeutic testing as indicated  [x] Obtained specimens sent to lab [x] Diagnostic testing as indicated  [x] Assess the gag reflex   MEDICATIONS [x] Conscious sedation medications  explained to patient  [x] Start IV per physician's order  and/or protocol  [x] Pre-procedure med. as ordered [x] Re-check IV access [x] Assist with administration of IV conscious sedation medication  [x] Start O2 per  nasal cannula, if needed   [x] IV fluids as indicated/ordered  [x] Administration of medications as ordered  [x] Medications as prescribed  [x] D/C O2 therapy as ordered   PROCEDURE/TREATMENT [x] Specific order by GI physician  [x] Specific procedure as scheduled  [x] Verify procedure as ordered [x] Time out/procedure verification checklist complete. [x] EGD and related procedures  [x] Assist physician with the procedure [x] Treatment as indicated   NUTRITION / DIET [x] NPO after midnight, as ordered [x] Verify NPO status [x] IV fluids as support [x] Clear liquids and/or ice chips or special diet  as ordered  [x] Tolerating clear liquids  [x] D/C IV fluids   ACTIVITY [x] Assess level of function  [x] Specified by physician  [x]Activity as tolerated [x] Position on left side [x] Position on left side and reposition patient as physician ordered [x] Gradually elevate HOB to fowler???s position  [x] Position changes as patient tolerated  [x] Ambulate as pre-procedure   PATIENT / SO EDUCATION [x] Pre,Intra, Post-procedure  teaching appropriate to procedure  [x]Conscious Sedation Teaching  [x] Pain Management - instructed [x] Encourage questions  [x] Clarify any concerns [x]Assist and support patient  [x]Safety devices maintain to  prevent patient injury  [x] Observe standard precautions [x] Physician confers with the family/S.O.  [x] Short visit from family in [place] area  [x] Review discharge instructions, take home medicine to family /S.O.; questions clarified  [x] Physician specific post-procedure orders  [x] S/S complications with proper F/U  [x] F/U office visits  [] Med info sheet/ copy of discharge  instruction given to pt./S.O.   OUTCOME PLANNING  DISCHARGE PLAN [x] Patient/S.O. will verbalize understanding of admission  procedure & expectations of outcome in realistic terms  [x] Patient verbalize the role of family/S.O. in plan of care  [x] Patient will have designated responsible person available for discharge. [x] Patient will demonstrate an  understanding of the planned  procedure and its related procedures and conscious sedation [x] Patient will:  - receive services according to the standards of care  - receive standards for conscious sedation  -  remain free of injury [x] Patient will:   - have stable vital signs based on Aldrete Score  -    be pain free or tolerable, have no signs of difficulty in breathing  - no abdominal distention, severe sore throat, chest pain, bleeding  -  return to pre-procedure level of conciousness   - tolerate fluid with no N/V  - ambulate as pre-procedure ADL  - verbalize  understanding of the discharge instructions      NURSE SIGNATURE:   __Christine Samuel GermanyVan Harlingen  __________________________   ________________________      __________________________    Luan Moore__Nancy L Niah Heinle  _____________________________________                                                            [id number],EA,V4,0/98,[id number],NS,R1,7/00,J

## 2015-12-19 NOTE — H&P (Signed)
History and Physical / Pre-Sedation Assessment    Patient:  Terri Booth   DOB:   06/24/1951     Intended Procedure:  egd    HPI: abdominal pain, nausea and recurrent vomitings    Nurses notes reviewed and agreed.  Medications reviewed  Allergies:   Allergies   Allergen Reactions   ??? Betadine [Povidone Iodine] Other (See Comments)     unknown   ??? Cefazolin Other (See Comments)     unknown   ??? Codeine Other (See Comments)     unknown   ??? Morphine Other (See Comments)     unknown   ??? Penicillins Other (See Comments)     unknown   ??? Sulfa Antibiotics Other (See Comments)     unknown   ??? Tape [place] Organ[Adhesive Tape] Other (See Comments)     unknown       Physical Exam:  Vital Signs:   Visit Vitals   ??? BP (!) 119/56   ??? Pulse 68   ??? Temp 97 ??F (36.1 ??C)   ??? Resp 18   ??? SpO2 98%      Pulmonary:Normal  Cardiac:Normal  Abdomen:Normal    Pre-Procedure Assessment / Plan:  ASA 2 - Patient with mild systemic disease with no functional limitations  Level of Sedation Plan:Moderate sedation  Post Procedure plan: Return to same level of care    I assessed the patient and find that the patient is in satisfactory condition to proceed with the planned procedure and sedation plan.    I have explained the risk, benefits, and alternatives to the procedure. The patient understands and agrees to proceed.  Terri StarchYes    Terri Booth  5:24 PM 12/19/2015

## 2015-12-19 NOTE — H&P (Signed)
[facility]                  [address]  [place], [place]  [phone]                                 HISTORY AND PHYSICAL    PATIENT NAME:  Terri Booth, Terri Booth                      DOB:      03-Mar-1951  MED REC NO:    [id number]                       ROOM:        ACCOUNT NO:    [id number]                       ADMISSION DATE: 12/19/2015  PHYSICIAN:     Rosalyn ChartersAhmad Devina Bezold, MD                     HISTORY OF PRESENT ILLNESS:  The patient was seen today and evaluated prior to  her procedure.  We have assessed all her medical problems as well as the  procedure risk.  We have evaluated the following problems.  1.  Gout.   2.  Insulin-dependent diabetes mellitus.  3.  Osteoarthritis.  4.  Severe morbid obesity.  5.  Coronary artery disease.  Status post triple vessel bypass surgery.    Other than her GI symptom with the nausea and vomiting, her medical problem  appears stable.  I have ordered fasting blood sugar her on admission, was  reported to be 78.  On the other hand, she has not had any gout episodes.  Her  blood pressure is well controlled. Her osteoarthritis is well managed with  Mobic.     MEDICATIONS:   1.  Zyloprim.  2.  Dulcolax.  3.  Vitamin D.  4.  Flonase.  5.  Januvia.   6.  Lantus.  7.  Magnesium.  8.  Melatonin.  9.  Prilosec.  10. Aldactone.   11. Duricef for chronic UTI.    PHYSICAL EXAMINATION:  GENERAL:  Revealed an obese woman in no distress.  SKIN:  Warm and dry.  HEENT:  Unremarkable.  NECK:  Supple.  No adenopathy noted.  No carotid bruits.  HEART:  Regular sinus rhythm, no murmur.  LUNGS:  Clear to auscultation.  ABDOMEN:  Soft, slightly tender epigastric region.  No masses.  No  hepatosplenomegaly.  Bowel sounds normal.  EXTREMITIES: With trace edema. These arthritic changes are seen.    IMPRESSION:  Overall medical condition is stable to proceed with outpatient  endoscopy.        Rosalyn ChartersAhmad Demetruis Depaul, MD      D: 12/19/2015 17:22:57  T: 12/19/2015 17:59:39   AA/nts  Job#: [id number]  Doc#: [id number]

## 2015-12-19 NOTE — Procedures (Signed)
[facility]                  [address]  [place], [place]  [phone]                                    PROCEDURE NOTE    PATIENT NAME:  Terri Booth, Terri                      DOB:      1950-10-05  MED REC NO:    [id number]                       ROOM:        ACCOUNT NO:    [id number]                       ADMISSION DATE: 12/19/2015  PHYSICIAN:     Rosalyn ChartersAhmad Taylore Hinde, MD                     DATE OF PROCEDURE:  12/19/2015    INDICATION FOR PROCEDURE:  Recurrent nausea and vomiting and abdominal pain.    DETAILS OF PROCEDURE:  With the patient in the left lateral position, and  after sedation with 50 mg of Demerol and 5 mg of  Versed IV, the Olympus  videoendoscope was introduced into the esophagus and advanced toward the GE  junction.  Esophagus was normal.  Stomach was carefully inspected.  It was  normal.  Biopsies were obtained for Helicobacter pylori from the antrum.  The  duodenum was normal.  The scope was then removed without complication.    IMPRESSION:  Normal esophagogastroduodenoscopy.        Rosalyn ChartersAhmad Avenell Sellers, MD      D: 12/19/2015 17:17:27  T: 12/19/2015 20:08:03  AA/nts  Job#: [id number]  Doc#: [id number]

## 2016-03-26 ENCOUNTER — Encounter: Admit: 2016-03-26

## 2016-03-26 ENCOUNTER — Inpatient Hospital Stay
Admission: EM | Admit: 2016-03-28 | Discharge: 2016-03-28 | Disposition: A | Discharge status: Other Acute Inpatient Hospital | Source: Home / Self Care | Admitting: Internal Medicine

## 2016-03-26 LAB — CBC WITH AUTO DIFFERENTIAL
Basophils %: 0.6 %
Basophils Absolute: 0.1 10*3/uL (ref 0.0–0.2)
Eosinophils %: 1.2 %
Eosinophils Absolute: 0.1 10*3/uL (ref 0.0–0.6)
Hematocrit: 37.7 % (ref 36.0–48.0)
Hemoglobin: 12 g/dL (ref 12.0–16.0)
Lymphocytes %: 24.9 %
Lymphocytes Absolute: 2.9 10*3/uL (ref 1.0–5.1)
MCH: 29.1 pg (ref 26.0–34.0)
MCHC: 31.7 g/dL (ref 31.0–36.0)
MCV: 91.8 fL (ref 80.0–100.0)
MPV: 8.7 fL (ref 5.0–10.5)
Monocytes %: 8 %
Monocytes Absolute: 0.9 10*3/uL (ref 0.0–1.3)
Neutrophils %: 65.3 %
Neutrophils Absolute: 7.7 10*3/uL (ref 1.7–7.7)
Platelets: 235 10*3/uL (ref 135–450)
RBC: 4.11 M/uL (ref 4.00–5.20)
RDW: 15.8 % — ABNORMAL HIGH (ref 12.4–15.4)
WBC: 11.8 10*3/uL — ABNORMAL HIGH (ref 4.0–11.0)

## 2016-03-26 LAB — POC URINE WITH MICROSCOPIC
Blood, Urine: NEGATIVE
Glucose, Ur: NEGATIVE mg/dL
Leukocyte Esterase, Urine: NEGATIVE
Nitrite, Urine: NEGATIVE
Protein, UA: NEGATIVE mg/dL
Specific Gravity, UA: 1.015 (ref 1.005–1.030)
Urobilinogen, Urine: 0.2 E.U./dL (ref <2.0)
pH, UA: 6.5 (ref 5.0–8.0)

## 2016-03-26 LAB — POCT VENOUS
BNP: 63 pg/mL (ref 0.0–99.9)
Base Excess, Ven: 13 — ABNORMAL HIGH (ref <=3)
CO2: 33 mmol/L — ABNORMAL HIGH (ref 21–32)
Calcium, Ionized: 1.11 mmol/L — ABNORMAL LOW (ref 1.12–1.32)
GFR African American: >60
GFR Non-African American: >60 (ref >60)
HCO3, Venous: 36.2 mmol/L — ABNORMAL HIGH (ref 23.0–29.0)
Lactate: 1.51 mmol/L (ref 0.40–2.00)
O2 Sat, Ven: 55 %
POC Anion Gap: 7 — ABNORMAL LOW (ref 10–20)
POC BUN: 7 mg/dL (ref 7–18)
POC Chloride: 92 mmol/L — ABNORMAL LOW (ref 99–110)
POC Creatinine: 0.9 mg/dL (ref 0.6–1.2)
POC Glucose: 110 mg/dl — ABNORMAL HIGH (ref 70–99)
POC Potassium: 4.5 mmol/L (ref 3.5–5.1)
POC Sodium: 132 mmol/L — ABNORMAL LOW (ref 136–145)
POC Troponin I: 0.01 ng/mL (ref 0.00–0.10)
TC02 (Calc), Ven: 38 mmol/L
pCO2, Ven: 48.8 mm Hg (ref 40.0–50.0)
pH, Ven: 7.478 — ABNORMAL HIGH (ref 7.320–7.420)
pO2, Ven: 27 mm Hg

## 2016-03-26 LAB — HEPATIC FUNCTION PANEL
ALT: 19 U/L (ref 10–40)
AST: 51 U/L — ABNORMAL HIGH (ref 15–37)
Albumin: 3 g/dL — ABNORMAL LOW (ref 3.4–5.0)
Alkaline Phosphatase: 132 U/L — ABNORMAL HIGH (ref 40–129)
Bilirubin, Direct: <0.2 mg/dL (ref 0.0–0.3)
Total Bilirubin: 0.4 mg/dL (ref 0.0–1.0)
Total Protein: 6.1 g/dL — ABNORMAL LOW (ref 6.4–8.2)

## 2016-03-26 LAB — LIPASE: Lipase: 12 U/L — ABNORMAL LOW (ref 13.0–60.0)

## 2016-03-26 MED ORDER — SODIUM CHLORIDE 0.9 % IV BOLUS
0.9 % | Freq: Once | INTRAVENOUS | Status: AC
Start: 2016-03-26 — End: 2016-03-26
  Administered 2016-03-26: 22:00:00 1000 mL via INTRAVENOUS

## 2016-03-26 MED ORDER — IOPAMIDOL 76 % IV SOLN
76 % | Freq: Once | INTRAVENOUS | Status: AC | PRN
Start: 2016-03-26 — End: 2016-03-26
  Administered 2016-03-26: 22:00:00 80 mL via INTRAVENOUS

## 2016-03-26 MED ORDER — SODIUM CHLORIDE 0.9 % IV BOLUS
0.9 % | Freq: Once | INTRAVENOUS | Status: AC
Start: 2016-03-26 — End: 2016-03-26
  Administered 2016-03-26: 20:00:00 1000 mL via INTRAVENOUS

## 2016-03-26 MED FILL — SODIUM CHLORIDE 0.9 % IV SOLN: 0.9 % | INTRAVENOUS | Qty: 1000

## 2016-03-26 NOTE — ED Notes (Signed)
Pt to CT     Terri FurlongJulia Lauren Vanna Sailer, RN  03/26/16 [phone]

## 2016-03-26 NOTE — ED Notes (Signed)
Pt difficult transfer to the wheelchair with a stand and pivot assist x 2. Pt placed back in bed and repositioned. Taken to US via Doctor, general practicestretcher.     Deveron FurlongJulia Lauren Azrael Huss, RN  03/26/16 [phone]

## 2016-03-26 NOTE — ED Notes (Signed)
Pt difficult stick, called Belenda CruiseMichelle B RN to attempt.     Deveron FurlongJulia Lauren Shyhiem Beeney, RN  03/26/16 [phone]

## 2016-03-26 NOTE — ED Provider Notes (Signed)
Date of evaluation: 03/26/2016    Chief Complaint   Nausea and Emesis      Nursing Notes, Past Medical Hx, Past Surgical Hx, Social Hx, Allergies, and Family Hx were reviewed.    History of Present Illness     Terri Booth is a 65 y.o. female who presents with a chief complaint of nausea and emesis. Patient states that she has had a several month history of postprandial nausea with emesis.  She states that this is nonbloody and nonbilious and occurs every time she tries to eat or drink.  She denies any associated abdominal pain.  No diarrhea.  Reports normal bowel movements.  States that she was sent from her nursing home today because she appeared more dehydrated to them.  She does state that she feels slightly dehydrated but states otherwise her symptoms are stable.  States that she has been seen by Dr. Almyra Brace previously for the symptoms and had an EGD which was reportedly normal.  She denies any other complaints.    Review of Systems     Review of Systems   Constitutional: Negative for chills and fever.   HENT: Negative for congestion.    Eyes: Negative for pain.   Respiratory: Negative for cough, shortness of breath and wheezing.    Cardiovascular: Negative for chest pain and leg swelling.   Gastrointestinal: Negative for abdominal pain, diarrhea, nausea and vomiting.   Genitourinary: Negative for dysuria.   Musculoskeletal: Negative for arthralgias.   Skin: Negative for rash.   Neurological: Negative for weakness and headaches.   All other systems reviewed and are negative.      Past Medical, Surgical, Family, and Social History         Diagnosis Date   ??? Anemia    ??? Anxiety    ??? Arthritis    ??? CAD (coronary artery disease)    ??? COPD (chronic obstructive pulmonary disease) (HCC)    ??? Diabetes mellitus (HCC)    ??? GERD (gastroesophageal reflux disease)    ??? Gout    ??? Hyperlipidemia    ??? Hypertension    ??? Hypo-osmolality and hyponatremia    ??? Non-STEMI (non-ST elevated myocardial infarction) (HCC)    ??? Obese    ???  UTI (urinary tract infection)          Procedure Laterality Date   ??? JOINT REPLACEMENT Right     hip   ??? KNEE SURGERY      right, left   ??? LEG AMPUTATION BELOW KNEE Left      Her family history is not on file.  She reports that she has never smoked. She does not have any smokeless tobacco history on file. She reports that she does not drink alcohol or use illicit drugs.    Medications     Current Discharge Medication List      CONTINUE these medications which have NOT CHANGED    Details   docusate sodium (COLACE) 100 MG capsule Take 100 mg by mouth daily      Magnesium Oxide 400 MG CAPS Take 800 mg by mouth daily      ondansetron (ZOFRAN) 4 MG tablet Take 4 mg by mouth 3 times daily as needed for Nausea or Vomiting      acetaminophen (TYLENOL) 325 MG tablet Take 650 mg by mouth every 4 hours as needed for Pain or Fever      diphenhydrAMINE (BENADRYL) 25 MG tablet Take 25-50 mg by mouth every 6 hours  as needed for Itching      Menthol, Topical Analgesic, 4 % GEL Apply 1 Dose topically every 8 hours as needed Right shoulder and knees      Benzocaine-Menthol (CEPACOL) 6-10 MG LOZG lozenge Take 1 lozenge by mouth every 4 hours as needed for Sore Throat      bisacodyl (DULCOLAX) 10 MG suppository Place 10 mg rectally daily as needed for Constipation      loperamide (IMODIUM) 2 MG capsule Take 2 mg by mouth 4 times daily as needed for Diarrhea      magnesium hydroxide (MILK OF MAGNESIA) 400 MG/5ML suspension Take 30 mLs by mouth daily as needed for Constipation      polyethylene glycol (GLYCOLAX) powder Take 17 g by mouth daily as needed      promethazine (PHENERGAN) 25 MG suppository Place 25 mg rectally every 6 hours as needed for Nausea      Sodium Phosphates (TGT SALINE LAXATIVE) ENEM Place 1 Dose rectally as needed      simethicone (MYLICON) 80 MG chewable tablet Take 80 mg by mouth every 6 hours as needed for Flatulence      calcium carbonate (TUMS) 500 MG chewable tablet Take 1 tablet by mouth every 4 hours as  needed for Heartburn      Menthol-Zinc Oxide (LANTISEPTIC MULTI-PURPOSE) 0.45-20 % OINT Apply 1 Dose topically as needed After peri care      allopurinol (ZYLOPRIM) 300 MG tablet Take 300 mg by mouth daily      loratadine-pseudoephedrine (CLARITIN-D 24 HOUR) 10-240 MG per extended release tablet Take 1 tablet by mouth daily      Bisacodyl (DULCOLAX PO) Take 5 mg by mouth daily as needed       ergocalciferol (ERGOCALCIFEROL) 50000 UNITS capsule Take 50,000 Units by mouth once a week      fluticasone (FLONASE) 50 MCG/ACT nasal spray 2 sprays by Nasal route daily      SITagliptin (JANUVIA) 50 MG tablet Take 50 mg by mouth daily      insulin glargine (LANTUS) 100 UNIT/ML injection vial Inject 18 Units into the skin nightly      melatonin 3 MG TABS tablet Take 5 mg by mouth daily as needed      meloxicam (MOBIC) 7.5 MG tablet Take 7.5 mg by mouth daily      omeprazole (PRILOSEC) 20 MG delayed release capsule Take 20 mg by mouth daily      spironolactone (ALDACTONE) 50 MG tablet Take 50 mg by mouth daily      cefadroxil (DURICEF) 500 MG capsule Take 500 mg by mouth 2 times daily      furosemide (LASIX) 40 MG tablet Take 40 mg by mouth 2 times daily      metFORMIN (GLUCOPHAGE) 1000 MG tablet Take 1,000 mg by mouth 2 times daily (with meals)      metoprolol tartrate (LOPRESSOR) 50 MG tablet Take 50 mg by mouth 2 times daily      minocycline (MINOCIN;DYNACIN) 100 MG capsule Take 100 mg by mouth 2 times daily      potassium & sodium phosphates (PHOS-NAK) 280-160-250 MG PACK Take 1 packet by mouth 2 times daily       gabapentin (NEURONTIN) 400 MG capsule Take 400 mg by mouth 3 times daily      fentanyl (DURAGESIC) Place 1 patch onto the skin every 72 hours 75 mcq/hr      ipratropium-albuterol (DUONEB) 0.5-2.5 (3) MG/3ML SOLN nebulizer solution Inhale 1 vial into  the lungs every 4 hours      cyclobenzaprine (FLEXERIL) 10 MG tablet Take 10 mg by mouth 3 times daily as needed for Muscle spasms      HYDROcodone-acetaminophen  (NORCO) 5-325 MG per tablet Take 1 tablet by mouth every 4 hours .      promethazine (PHENERGAN) 12.5 MG tablet Take 12.5 mg by mouth every 6 hours as needed for Nausea             Allergies     She is allergic to latex; betadine [povidone iodine]; cefazolin; codeine; morphine; penicillins; sulfa antibiotics; and tape [adhesive tape].    Physical Exam     ED Triage Vitals   Enc Vitals Group      BP 03/26/16 1330 130/98      Pulse 03/26/16 1331 120      Resp 03/26/16 1331 16      Temp 03/26/16 1331 98.4 ??F (36.9 ??C)      Temp Source 03/26/16 1331 Oral      SpO2 03/26/16 1330 100 %      Weight 03/26/16 1331 238 lb (108 kg)      Height 03/26/16 1331 5\' 3"  (1.6 m)      Head Cir --       Peak Flow --       Pain Score --       Pain Loc --       Pain Edu? --       Excl. in GC? --        General:  Non-toxic, no acute distress, fully cooperative with my exam    HEENT:  NCAT    Neck:  Supple    Pulmonary:   No increased work of breathing; CTAB    Cardiac:  Tachycardic with a regular rhythm, no murmur, thrill or gallop. Capillary refill <3s. 2+ distal pulses    Abdomen:  Soft, nontender, nondistended; no focal rebound or guarding    Musculoskeletal:  Grossly intact without obvious injury or deformity    Neuro:  Awake alert and oriented ??4, moves all 4 extremities spontaneously and symmetrically    Skin: No rashes    Diagnostic Results       RADIOLOGY:  CT ABDOMEN PELVIS W IV CONTRAST Additional Contrast? None   Final Result      No definite evidence of acute intra-abdominal or pelvic pathology   identified.          US ABDOMINAL LIMITED   Final Result      Gallbladder sludge.       2 mm echogenic structure here into the inferior gallbladder wall,   likely small polyp.                      LABS:   Labs Reviewed   HEPATIC FUNCTION PANEL - Abnormal; Notable for the following:        Result Value    Total Protein 6.1 (*)     Alb 3.0 (*)     Alkaline Phosphatase 132 (*)     AST 51 (*)     All other components within normal limits    LIPASE - Abnormal; Notable for the following:     Lipase 12.0 (*)     All other components within normal limits   CBC WITH AUTO DIFFERENTIAL - Abnormal; Notable for the following:     WBC 11.8 (*)     RDW 15.8 (*)  All other components within normal limits   POC URINE WITH MICROSCOPIC - Abnormal; Notable for the following:     Bilirubin Urine SMALL (*)     Ketones, Urine TRACE (*)     All other components within normal limits   POCT VENOUS - Abnormal; Notable for the following:     pH, Ven 7.478 (*)     HCO3, Venous 36.2 (*)     Base Excess, Ven 13 (*)     All other components within normal limits   POCT VENOUS - Abnormal; Notable for the following:     POC Sodium 132 (*)     POC Chloride 92 (*)     CO2 33 (*)     POC Anion Gap 7 (*)     POC Glucose 110 (*)     Calcium, Ion 1.11 (*)     All other components within normal limits   BASIC METABOLIC PANEL   MAGNESIUM   POCT CHEM BASIC W ICA   POC URINE WITH MICROSCOPIC   POCT LACTIC ACID (LACTATE)   POCT BLOOD GASES   POCT B-TYPE NATRIURETIC PEPTIDE (BNP)   POCT TROPONIN   POCT VENOUS   POCT VENOUS       RECENT VITALS:  BP: 110/69, Temp: 98.4 ??F (36.9 ??C), Pulse: 117, Resp: 15     Procedures     None    ED Course     The patient was given the following medications:  Orders Placed This Encounter   Medications   ??? 0.9 % sodium chloride bolus   ??? iopamidol (ISOVUE-370) 76 % injection 80 mL   ??? 0.9 % sodium chloride bolus       ED Course       CONSULTS:  IP CONSULT TO GI  IP CONSULT TO GI  IP CONSULT TO HOSPITALIST    MEDICAL DECISION MAKING     Cleva Camero is a 65 y.o. female who has a now chronic history of postprandial nausea with emesis who presents today with tachycardia and concern for dehydration.  Has had a normal EGD performed by Dr. Laurann Montana several months ago.    Quadrant ultrasound obtained here to evaluate for hepatobiliary pathology and this was normal with only some gallbladder sludging.  The may be symptomatic biliary colic, but with only some sludge and  this was thought to be less likely.  Patient was tachycardic and did appear somewhat dehydrated.  Tachycardia did not respond to IV hydration.  Case discussed with Dr. Laurann Montana who recommended CT abdomen and pelvis.  This did not reveal any evidence of acute intra-abdominal pathology.  Due to the patient's persistent symptoms without clear diagnosis and objective tachycardia/dehydration, we will admit for fluid hydration and inpatient evaluation by GI.        Clinical Impression     1. Dehydration        Disposition/Plan     PATIENT REFERRED TO:  No follow-up provider specified.    DISCHARGE MEDICATIONS:  Current Discharge Medication List          DISPOSITION Admitted     Genelle Bal, MD  03/26/16 2145

## 2016-03-26 NOTE — ED Notes (Signed)
Bed: B16  Expected date:   Expected time:   Means of arrival:   Comments:  squAD     Cheral BayJennifer Rashaan Wyles, RN  03/26/16 1324

## 2016-03-26 NOTE — ED Notes (Signed)
Pt to US. Unable to draw blood prior to patient's departure. Did assist patient with repositioning, linen and depends change prior to her leaving the ED.      Deveron FurlongJulia Lauren Darlean Warmoth, RN  03/26/16 [phone]

## 2016-03-26 NOTE — Progress Notes (Signed)
Admission questions completed.  Updates given to ED RN.

## 2016-03-27 LAB — BASIC METABOLIC PANEL
Anion Gap: 7 (ref 3–16)
BUN: 10 mg/dL (ref 7–20)
CO2: 29 mmol/L (ref 21–32)
Calcium: 8.7 mg/dL (ref 8.3–10.6)
Chloride: 98 mmol/L — ABNORMAL LOW (ref 99–110)
Creatinine: 0.8 mg/dL (ref 0.6–1.2)
GFR African American: >60 (ref >60)
GFR Non-African American: >60 (ref >60)
Glucose: 141 mg/dL — ABNORMAL HIGH (ref 70–99)
Potassium: 4.1 mmol/L (ref 3.5–5.1)
Sodium: 134 mmol/L — ABNORMAL LOW (ref 136–145)

## 2016-03-27 LAB — POCT GLUCOSE
POC Glucose: 227 mg/dl — ABNORMAL HIGH (ref 70–99)
POC Glucose: 124 mg/dl — ABNORMAL HIGH (ref 70–99)
POC Glucose: 222 mg/dl — ABNORMAL HIGH (ref 70–99)
POC Glucose: 154 mg/dl — ABNORMAL HIGH (ref 70–99)

## 2016-03-27 LAB — MAGNESIUM: Magnesium: 1.8 mg/dL (ref 1.80–2.40)

## 2016-03-27 MED ORDER — NORMAL SALINE FLUSH 0.9 % IV SOLN
0.9 | INTRAVENOUS | Status: DC | PRN
Start: 2016-03-27 — End: 2016-03-28

## 2016-03-27 MED ORDER — CEPHALEXIN 250 MG PO CAPS
250 MG | Freq: Four times a day (QID) | ORAL | Status: DC
Start: 2016-03-27 — End: 2016-03-28
  Administered 2016-03-28 (×2): 250 mg via ORAL

## 2016-03-27 MED ORDER — SPIRONOLACTONE 25 MG PO TABS
25 MG | Freq: Every day | ORAL | Status: DC
Start: 2016-03-27 — End: 2016-03-27
  Administered 2016-03-27: 14:00:00 50 mg via ORAL

## 2016-03-27 MED ORDER — INSULIN GLARGINE 100 UNIT/ML SC SOLN
100 UNIT/ML | Freq: Every evening | SUBCUTANEOUS | Status: DC
Start: 2016-03-27 — End: 2016-03-28
  Administered 2016-03-27 – 2016-03-28 (×2): 18 [IU] via SUBCUTANEOUS

## 2016-03-27 MED ORDER — PANTOPRAZOLE SODIUM 40 MG PO TBEC
40 MG | Freq: Every day | ORAL | Status: DC
Start: 2016-03-27 — End: 2016-03-28
  Administered 2016-03-27 – 2016-03-28 (×2): 40 mg via ORAL

## 2016-03-27 MED ORDER — MAGNESIUM SULFATE IN D5W 1-5 GM/100ML-% IV SOLN
1-5 | INTRAVENOUS | Status: DC | PRN
Start: 2016-03-27 — End: 2016-03-28

## 2016-03-27 MED ORDER — SODIUM CHLORIDE 0.9 % IV SOLN
0.9 % | INTRAVENOUS | Status: DC
Start: 2016-03-27 — End: 2016-03-28
  Administered 2016-03-27 – 2016-03-28 (×2): via INTRAVENOUS

## 2016-03-27 MED ORDER — CALCIUM CARBONATE ANTACID 500 MG PO CHEW
500 MG | Freq: Three times a day (TID) | ORAL | Status: DC | PRN
Start: 2016-03-27 — End: 2016-03-28
  Administered 2016-03-27: 08:00:00 500 mg via ORAL

## 2016-03-27 MED ORDER — POTASSIUM CHLORIDE 10 MEQ/100ML IV SOLN
10 | INTRAVENOUS | Status: DC | PRN
Start: 2016-03-27 — End: 2016-03-28

## 2016-03-27 MED ORDER — CYCLOBENZAPRINE HCL 10 MG PO TABS
10 MG | Freq: Three times a day (TID) | ORAL | Status: DC | PRN
Start: 2016-03-27 — End: 2016-03-28
  Administered 2016-03-27 – 2016-03-28 (×2): 10 mg via ORAL

## 2016-03-27 MED ORDER — ENOXAPARIN SODIUM 40 MG/0.4ML SC SOLN
40 | Freq: Every day | SUBCUTANEOUS | Status: DC
Start: 2016-03-27 — End: 2016-03-28
  Administered 2016-03-27 – 2016-03-28 (×2): 40 mg via SUBCUTANEOUS

## 2016-03-27 MED ORDER — NORMAL SALINE FLUSH 0.9 % IV SOLN
0.9 | Freq: Two times a day (BID) | INTRAVENOUS | Status: DC
Start: 2016-03-27 — End: 2016-03-28
  Administered 2016-03-27: 04:00:00 10 mL via INTRAVENOUS

## 2016-03-27 MED ORDER — METOPROLOL TARTRATE 50 MG PO TABS
50 MG | Freq: Two times a day (BID) | ORAL | Status: DC
Start: 2016-03-27 — End: 2016-03-28
  Administered 2016-03-27 – 2016-03-28 (×4): 50 mg via ORAL

## 2016-03-27 MED ORDER — GABAPENTIN 400 MG PO CAPS
400 MG | Freq: Three times a day (TID) | ORAL | Status: DC
Start: 2016-03-27 — End: 2016-03-28
  Administered 2016-03-27 – 2016-03-28 (×5): 400 mg via ORAL

## 2016-03-27 MED ORDER — ONDANSETRON HCL 4 MG/2ML IJ SOLN
4 | Freq: Four times a day (QID) | INTRAMUSCULAR | Status: DC | PRN
Start: 2016-03-27 — End: 2016-03-28
  Administered 2016-03-27: 07:00:00 4 mg via INTRAVENOUS

## 2016-03-27 MED FILL — PROTONIX 40 MG PO TBEC: 40 MG | ORAL | Qty: 1

## 2016-03-27 MED FILL — SPIRONOLACTONE 25 MG PO TABS: 25 MG | ORAL | Qty: 2

## 2016-03-27 MED FILL — LANTUS 100 UNIT/ML SC SOLN: 100 UNIT/ML | SUBCUTANEOUS | Qty: 10

## 2016-03-27 MED FILL — GABAPENTIN 400 MG PO CAPS: 400 MG | ORAL | Qty: 1

## 2016-03-27 MED FILL — ONDANSETRON HCL 4 MG/2ML IJ SOLN: 4 MG/2ML | INTRAMUSCULAR | Qty: 2

## 2016-03-27 MED FILL — CALCIUM ANTACID 500 MG PO CHEW: 500 MG | ORAL | Qty: 1

## 2016-03-27 MED FILL — METOPROLOL TARTRATE 50 MG PO TABS: 50 MG | ORAL | Qty: 1

## 2016-03-27 MED FILL — CYCLOBENZAPRINE HCL 10 MG PO TABS: 10 MG | ORAL | Qty: 1

## 2016-03-27 MED FILL — LOVENOX 40 MG/0.4ML SC SOLN: 40 MG/0.4ML | SUBCUTANEOUS | Qty: 0.4

## 2016-03-27 NOTE — Plan of Care (Signed)
Problem: Falls - Risk of  Goal: Absence of falls  Outcome: Ongoing  Patient is free from falls, patient in bed in lowest position, bed wheels locked, bed alarm on, call light and bed table within reach, bathroom checked q2hrs and PRN. Chair alarm on when used, wheels locked. Patient is educated to call for assistance. Will continue to monitor        Problem: Risk for Impaired Skin Integrity  Goal: Tissue integrity - skin and mucous membranes  Structural intactness and normal physiological function of skin and  mucous membranes.   Outcome: Ongoing  Patient is free from skin breakdown, skin assessed frequently and PRN, positioned or turned q2hrs and PRN, bed linens are kept dry and free of wrinkles. Will continue to monitor        Problem: Nausea/Vomiting:  Goal: Absence of nausea/vomiting  Absence of nausea/vomiting  Outcome: Ongoing  Patient nausea is under control by PRN medications, educated to rise HOB or roll on side if vomiting to prevent aspiration.  Patient is able to drink and eat 75% of the meals, told to notify RN if worsening. Will continue to monitor.

## 2016-03-27 NOTE — Progress Notes (Signed)
Pt AO4, VSS, c/o nausea and belching starting at 0230, zofran and tums were given to relieve the symptoms. No vomiting noted. Pt resting in bed w bed alarm on and call light in reach, told to call for assist. Will continue to monitor

## 2016-03-27 NOTE — Care Coordination-Inpatient (Signed)
[facility] [place] [phone]. Laelia RN asked me to call about pt's prosthesis but they will not have a driver until Monday. They will bring her prosthesis and glasses over on 07/03.     Electronically signed by Archie PattenShannon Sharry Beining, RN on 03/27/2016 at 3:58 PM

## 2016-03-27 NOTE — Consults (Signed)
G.I:    Patient was seen and examined. History obtained in details. Records reviewed .    Admitted with recurrent nausea and vomiting. slight abdominal discomfort in RUQ . No heartburn. No diarrhea or constipation.    Reports  100 pound weight loss which she believes from her recurrent vomiting for the past 8 months. Appetite is normal.  No heartburn or dysphagia.  EGD on march 24th 2017 was normal    Other medical problems include DM, hypertension , CAD , gout, osteoarthritis, copd and obesity    ROS:     As above, nothing else remarkable     MEDS: as in records    FH: as in records    SH: as in records      PE :    Skin : normal  HEENT: NR  Neck: supple, no thyromegaly or adenopathy  Heart : RSR  Lungs: clear   Abdomen: soft minimally tender RUQ but also in LUQ.Marland Kitchen. No organomegaly. No peritoneal signs    Ass:    Recurrent vomiting  DM  Sludge in GB  CAD  Obesity  osteoarthritis  Recent weight loss  Dehydration with tachycardia    Plan:    I agree with gastric emptying study to evaluate for gastroparesis    Will also order HIDA scan with EF . It may have to be done on Wednesday because of the holiday.    Iv hydration    Discussed with Mrs Katrinka BlazingSmith and answered all her questions      Thank you    Rosalyn ChartersAhmad Annison Birchard, m.d.  03-27-16

## 2016-03-27 NOTE — Progress Notes (Addendum)
H/p dictation id [id number].  Date of service 03/26/2016.  Non specific nausea and vomiting.  Tachycardia.  H/o CHF.

## 2016-03-27 NOTE — Plan of Care (Signed)
Problem: Falls - Risk of  Goal: Absence of falls  Outcome: Ongoing  Pt resting quietly, states pain level now a 2 on scale 0:10. Call light in place,pt. instructed to call for assistance, and pt. verbalized understanding of this.

## 2016-03-27 NOTE — Progress Notes (Signed)
Hospitalist Progress Note      PCP: No primary care provider on file.    Date of Admission: 03/26/2016    Chief Complaint on Admission: recurrent nausea vomiting    Pt Seen/Examined and Chart Reviewed. Admitting dx : recurrent N/V, dehydration    SUBJECTIVE/OBJECTIVE:   Feels nauseous, no vomiting, but did not eat yet. No abd pain.   She does not remember if she was tested for gastroparesis before.     Allergies  Latex; Benadryl [diphenhydramine]; Betadine [povidone iodine]; Cefazolin; Codeine; Morphine; Penicillins; Sulfa antibiotics; and Tape [adhesive tape]    Medications      Scheduled Meds:  ??? gabapentin  400 mg Oral TID   ??? insulin glargine  18 Units Subcutaneous Nightly   ??? metoprolol tartrate  50 mg Oral BID   ??? pantoprazole  40 mg Oral QAM AC   ??? sodium chloride flush  10 mL Intravenous 2 times per day   ??? enoxaparin  40 mg Subcutaneous Daily       Infusions:  ??? sodium chloride         PRN Meds:  calcium carbonate, cyclobenzaprine, sodium chloride flush, ondansetron, potassium chloride, magnesium sulfate    Vitals    TEMPERATURE:  Current - Temp: 97.5 ??F (36.4 ??C); Max - Temp  Avg: 97.7 ??F (36.5 ??C)  Min: 97.4 ??F (36.3 ??C)  Max: 98.4 ??F (36.9 ??C)  RESPIRATIONS RANGE: Resp  Avg: 16.7  Min: 15  Max: 18  PULSE RANGE: Pulse  Avg: 113.1  Min: 86  Max: 126  BLOOD PRESSURE RANGE:  Systolic (24hrs), Avg:123 , Min:110 , Max:132   ; Diastolic (24hrs), Avg:79, Min:69, Max:98    PULSE OXIMETRY RANGE: SpO2  Avg: 96.8 %  Min: 93 %  Max: 100 %  24HR INTAKE/OUTPUT:    Intake/Output Summary (Last 24 hours) at 03/27/16 1229  Last data filed at 03/26/16 1526   Gross per 24 hour   Intake                0 ml   Output              500 ml   Net             -500 ml       Exam:      General appearance: No apparent distress, appears stated age and cooperative.  Lungs: Clear to ascultation, bilaterally without Rales/Wheezes/Rhonchi with good respiratory effort.  Heart: Regular rate and rhythm with Normal S1/S2 without  murmurs,  rubs or gallops, point of maximum impulse non-displaced  Abdomen: Soft, non-tender or non-distended without rigidity or guarding and positive bowel sounds all four quadrants.  Extremities: No clubbing, cyanosis, or edema bilaterally.  S/p left BKA.   Skin: Skin color, texture, dry and turgor is poor.  No rashes or lesions.  Neurologic: Alert and oriented X 3,  grossly non-focal.  Mental status: Alert, oriented, thought content appropriate.        Data    Recent Labs      03/26/16   1649   WBC  11.8*   HGB  12.0   HCT  37.7   PLT  235      Recent Labs      03/26/16   1549  03/27/16   0713   NA   --   134*   K   --   4.1   CL   --   98*   CO2  33*  29   BUN   --   10   CREATININE  0.9  0.8     Recent Labs      03/26/16   1535   AST  51*   ALT  19   BILIDIR  <0.2   BILITOT  0.4   ALKPHOS  132*     No results for input(s): INR in the last 72 hours.  Recent Labs      03/26/16   1549   TROPONINI  0.01       Consults:     IP CONSULT TO GI  IP CONSULT TO GI  IP CONSULT TO HOSPITALIST  IP CONSULT TO PEDIATRIC GASTROENTEROLOGY    There are no active hospital problems to display for this patient.        ASSESSMENT AND PLAN      1. Recurrent nausea and vomiting:  Etiology not clear. Had negative EGD. abd ct with gallbladder sludge. Was not tested for gastroparesis before.   GI consultation requested. Will get gastric emptying study done if possible this weekend.     2. HTN: controlled with metoprolol    3. DM type 2: controlled. Cont with lantus and ISS    4. CAD, s/p CABG: only on metoprolol. Not on aspirin at baseline.         DVT Prophylaxis: lovenox  Diet: DIET CARB CONTROL;  Code Status: Full Code    PT/OT Eval Status: at baseline    Dispo - inpt    Ramon Dredgelga A Kadedra Vanaken, MD

## 2016-03-27 NOTE — Progress Notes (Signed)
Pt AO4, VSS except for HR 120s, pt is asymptomatic and tolerating diet well, no nausea/vomiting noted, is educated to increase drinking fluid for hydration. Excoriation, redness, and skin tears are noted under R breast, L abd skin fold, L underarm, and at the buttocks. Pt skin folds were cleaned up w bath wipes and treated w aloe cream and interdry. Pt resting in bed w bed alarm on and call light in reach, told to call for assist. Denies needs at the time. Will continue to monitor

## 2016-03-28 DIAGNOSIS — E860 Dehydration: Principal | ICD-10-CM

## 2016-03-28 LAB — POCT GLUCOSE
POC Glucose: 131 mg/dl — ABNORMAL HIGH (ref 70–99)
POC Glucose: 114 mg/dl — ABNORMAL HIGH (ref 70–99)
POC Glucose: 103 mg/dl — ABNORMAL HIGH (ref 70–99)

## 2016-03-28 MED ORDER — GLUCAGON HCL RDNA (DIAGNOSTIC) 1 MG IJ SOLR
1 | INTRAMUSCULAR | Status: DC | PRN
Start: 2016-03-28 — End: 2016-03-28

## 2016-03-28 MED ORDER — DEXTROSE 50 % IV SOLN
50 | INTRAVENOUS | Status: DC | PRN
Start: 2016-03-28 — End: 2016-03-28

## 2016-03-28 MED ORDER — GLUCOSE 40 % PO GEL
40 | ORAL | Status: DC | PRN
Start: 2016-03-28 — End: 2016-03-28

## 2016-03-28 MED ORDER — DEXTROSE 5 % IV SOLN
5 | INTRAVENOUS | Status: DC | PRN
Start: 2016-03-28 — End: 2016-03-28

## 2016-03-28 MED FILL — METOPROLOL TARTRATE 50 MG PO TABS: 50 MG | ORAL | Qty: 1

## 2016-03-28 MED FILL — PROTONIX 40 MG PO TBEC: 40 MG | ORAL | Qty: 1

## 2016-03-28 MED FILL — LOVENOX 40 MG/0.4ML SC SOLN: 40 MG/0.4ML | SUBCUTANEOUS | Qty: 0.4

## 2016-03-28 MED FILL — CEPHALEXIN 250 MG PO CAPS: 250 MG | ORAL | Qty: 1

## 2016-03-28 MED FILL — GABAPENTIN 400 MG PO CAPS: 400 MG | ORAL | Qty: 1

## 2016-03-28 MED FILL — CYCLOBENZAPRINE HCL 10 MG PO TABS: 10 MG | ORAL | Qty: 1

## 2016-03-28 NOTE — Discharge Summary (Signed)
Report called to [facility] [place]. Pt ate 100% of breakfast and is currently eating her lunch.

## 2016-03-28 NOTE — Care Coordination-Inpatient (Signed)
Discharge noted if patient is able to tolerate breakfast. Arranged for transportation via [facility] at 1215 today to [facility]. Report can be called to [phone] and will fax AVS/COC and any scripts to [phone].     Noelly RN and Dr. Donnetta SimpersMelzer is aware of transport time. I also called the facility to update them of discharge plans.     Electronically signed by Archie PattenShannon Jontae Adebayo, RN on 03/28/2016 at 11:14 AM

## 2016-03-28 NOTE — Progress Notes (Signed)
Pt AO4, VSS, no c/o headache, chestpain, abd pain, sob, or numbness tingling. Pt tolerating diet w/o nausea vomiting. IVF infusing as order. Pt in bed w alarm on and call light in reach. Told to call for assist. Will continue to monitor

## 2016-03-28 NOTE — Discharge Summary (Signed)
Hospital Medicine Discharge Summary      Patient ID: Terri Booth      Patient's PCP: No primary care provider on file.    Admit Date: 03/26/2016     Discharge Date: 03/28/2016      Admitting Physician: Humphrey RollsSiddharth K Mushrif, MD    Discharge Physician: Ramon Dredgelga A Philena Obey, MD     Discharge Diagnoses:     Active Hospital Problems    Diagnosis Date Noted   ??? Nausea & vomiting [R11.2] 03/28/2016         The patient was seen and examined on day of discharge and this discharge summary is in conjunction with any daily progress note from day of discharge.    Hospital Course:     65 y/o female with h/o HTN, DM type 2, chronic prosthetic joint infection, presented from her nursing home with persistent N/V and dehydration.  She had these symptoms for several months. Had outpatient EGD by Dr. Almyra BraceAttar in March 2017, which was negative.   CT abd here showed gallbladder sludge, but liver function was normal.     She was given IVF and markedly improved. Was tolerating regular diet for 2 days before discharge. Given her stability- she was given scripts for gastric emptying study and HIDA scan with CCK to be done as outpatient. She will follow with Dr. Almyra BraceAttar once results are completed.     Consults:     GI Dr. Almyra BraceAttar    Significant Diagnostic Studies: as above, and as follows: CT abd    Treatments: as above    Disposition: SNF     Discharged Condition: Stable    Code Status: Prior    Activity: activity as tolerated    Diet: diabetic diet    Follow Up: Primary Care Physician in one week    Exam:       General appearance: No apparent distress, appears stated age and cooperative.  Lungs: Clear to ascultation, bilaterally without Rales/Wheezes/Rhonchi with good respiratory effort.  Heart: Regular rate and rhythm with Normal S1/S2 without  murmurs, rubs or gallops, point of maximum impulse non-displaced  Abdomen: Soft, non-tender or non-distended without rigidity or guarding and positive bowel sounds all four quadrants.  Extremities: No clubbing,  cyanosis, or edema bilaterally.  S/p left BKA.   Skin: Skin color, texture, dry and turgor is poor.  No rashes or lesions.  Neurologic: Alert and oriented X 3,  grossly non-focal.  Mental status: Alert, oriented, thought content appropriate.    Labs: For convenience and continuity at follow-up the following most recent labs are provided:    CBC:   Lab Results   Component Value Date    WBC 11.8 03/26/2016    HGB 12.0 03/26/2016    HCT 37.7 03/26/2016    PLT 235 03/26/2016       RENAL:   Lab Results   Component Value Date    NA 134 03/27/2016    K 4.1 03/27/2016    CL 98 03/27/2016    CO2 29 03/27/2016    BUN 10 03/27/2016    CREATININE 0.8 03/27/2016           Discharge Medications:   Discharge Medication List as of 03/28/2016 11:22 AM           Details   docusate sodium (COLACE) 100 MG capsule Take 100 mg by mouth dailyHistorical Med      Magnesium Oxide 400 MG CAPS Take 800 mg by mouth dailyHistorical Med  ondansetron (ZOFRAN) 4 MG tablet Take 4 mg by mouth 3 times daily as needed for Nausea or VomitingHistorical Med      acetaminophen (TYLENOL) 325 MG tablet Take 650 mg by mouth every 4 hours as needed for Pain or FeverHistorical Med      diphenhydrAMINE (BENADRYL) 25 MG tablet Take 25-50 mg by mouth every 6 hours as needed for ItchingHistorical Med      Menthol, Topical Analgesic, 4 % GEL Apply 1 Dose topically every 8 hours as needed Right shoulder and kneesHistorical Med      Benzocaine-Menthol (CEPACOL) 6-10 MG LOZG lozenge Take 1 lozenge by mouth every 4 hours as needed for Sore ThroatHistorical Med      bisacodyl (DULCOLAX) 10 MG suppository Place 10 mg rectally daily as needed for ConstipationHistorical Med      loperamide (IMODIUM) 2 MG capsule Take 2 mg by mouth 4 times daily as needed for DiarrheaHistorical Med      magnesium hydroxide (MILK OF MAGNESIA) 400 MG/5ML suspension Take 30 mLs by mouth daily as needed for ConstipationHistorical Med      polyethylene glycol (GLYCOLAX) powder Take 17 g by  mouth daily as neededHistorical Med      promethazine (PHENERGAN) 25 MG suppository Place 25 mg rectally every 6 hours as needed for NauseaHistorical Med      Sodium Phosphates (TGT SALINE LAXATIVE) ENEM Place 1 Dose rectally as neededHistorical Med      simethicone (MYLICON) 80 MG chewable tablet Take 80 mg by mouth every 6 hours as needed for FlatulenceHistorical Med      calcium carbonate (TUMS) 500 MG chewable tablet Take 1 tablet by mouth every 4 hours as needed for HeartburnHistorical Med      Menthol-Zinc Oxide (LANTISEPTIC MULTI-PURPOSE) 0.45-20 % OINT Apply 1 Dose topically as needed After peri careHistorical Med      allopurinol (ZYLOPRIM) 300 MG tablet Take 300 mg by mouth dailyHistorical Med      loratadine-pseudoephedrine (CLARITIN-D 24 HOUR) 10-240 MG per extended release tablet Take 1 tablet by mouth dailyHistorical Med      Bisacodyl (DULCOLAX PO) Take 5 mg by mouth daily as needed Historical Med      ergocalciferol (ERGOCALCIFEROL) 50000 UNITS capsule Take 50,000 Units by mouth once a weekHistorical Med      fluticasone (FLONASE) 50 MCG/ACT nasal spray 2 sprays by Nasal route dailyHistorical Med      SITagliptin (JANUVIA) 50 MG tablet Take 50 mg by mouth dailyHistorical Med      insulin glargine (LANTUS) 100 UNIT/ML injection vial Inject 18 Units into the skin nightlyHistorical Med      melatonin 3 MG TABS tablet Take 5 mg by mouth daily as neededHistorical Med      meloxicam (MOBIC) 7.5 MG tablet Take 7.5 mg by mouth dailyHistorical Med      omeprazole (PRILOSEC) 20 MG delayed release capsule Take 20 mg by mouth dailyHistorical Med      spironolactone (ALDACTONE) 50 MG tablet Take 50 mg by mouth dailyHistorical Med      cefadroxil (DURICEF) 500 MG capsule Take 500 mg by mouth 2 times dailyHistorical Med      furosemide (LASIX) 40 MG tablet Take 40 mg by mouth 2 times dailyHistorical Med      metFORMIN (GLUCOPHAGE) 1000 MG tablet Take 1,000 mg by mouth 2 times daily (with meals)Historical Med       metoprolol tartrate (LOPRESSOR) 50 MG tablet Take 50 mg by mouth 2 times dailyHistorical Med  minocycline (MINOCIN;DYNACIN) 100 MG capsule Take 100 mg by mouth 2 times dailyHistorical Med      potassium & sodium phosphates (PHOS-NAK) 280-160-250 MG PACK Take 1 packet by mouth 2 times daily Historical Med      gabapentin (NEURONTIN) 400 MG capsule Take 400 mg by mouth 3 times dailyHistorical Med      fentanyl (DURAGESIC) Place 1 patch onto the skin every 72 hours 75 mcq/hrHistorical Med      ipratropium-albuterol (DUONEB) 0.5-2.5 (3) MG/3ML SOLN nebulizer solution Inhale 1 vial into the lungs every 4 hoursHistorical Med      cyclobenzaprine (FLEXERIL) 10 MG tablet Take 10 mg by mouth 3 times daily as needed for Muscle spasmsHistorical Med      HYDROcodone-acetaminophen (NORCO) 5-325 MG per tablet Take 1 tablet by mouth every 4 hours .Historical Med      promethazine (PHENERGAN) 12.5 MG tablet Take 12.5 mg by mouth every 6 hours as needed for NauseaHistorical Med                Time Spent on discharge is more than 30 minutes in the examination, evaluation, counseling and review of medications and discharge plan.      Signed:  Ramon Dredgelga A Kentley Cedillo, MD   03/29/2016      Thank you No primary care provider on file. for the opportunity to be involved in this patient's care. If you have any questions or concerns please feel free to contact me at [phone].

## 2016-03-28 NOTE — Progress Notes (Signed)
Pt is transferred to new bed w air mattress. Bed in lowest position, alarm on and call light in reach. Will continue to monitor

## 2016-03-28 NOTE — Plan of Care (Signed)
Problem: Falls - Risk of  Goal: Absence of falls  Outcome: Ongoing  Patient is free from falls, patient in bed in lowest position, bed wheels locked, bed alarm on, call light and bed table within reach, bathroom checked q2hrs and PRN. Chair alarm on when used, wheels locked. Patient is educated to call for assistance. Will continue to monitor        Problem: Risk for Impaired Skin Integrity  Goal: Tissue integrity - skin and mucous membranes  Structural intactness and normal physiological function of skin and  mucous membranes.   Outcome: Ongoing  Patient is free from skin breakdown, skin assessed frequently and PRN, positioned or turned q2hrs and PRN, bed linens are kept dry and free of wrinkles. Will continue to monitor        Problem: Nausea/Vomiting:  Goal: Absence of nausea/vomiting  Absence of nausea/vomiting   Outcome: Ongoing  Patient nausea is under control by PRN medications, educated to rise HOB or roll on side if vomiting to prevent aspiration.  Patient is able to drink and eat 75% of the meals, told to notify RN if worsening. Will continue to monitor.

## 2016-03-28 NOTE — Other (Addendum)
Patient Acct Nbr:  [id number]  Primary AUTH/CERT:    Primary Insurance Company Name:   MEDICARE  Primary Insurance Plan Name:  MEDICARE INPT/OUTPT  Primary Insurance Group Number:    [facility]  Primary Insurance Policy Number:  [id number]    Secondary AUTH/CERT:    Secondary Insurance Company Name:   MEDICAID  [facility]  [facility] [place]  Secondary Insurance Group Number:    Secondary Insurance Plan Type: X  Secondary Insurance Policy Number:  [id number]

## 2016-04-19 ENCOUNTER — Encounter

## 2016-04-19 ENCOUNTER — Inpatient Hospital Stay: Admit: 2016-04-19 | Attending: Internal Medicine

## 2016-04-19 DIAGNOSIS — R1110 Vomiting, unspecified: Secondary | ICD-10-CM

## 2016-04-19 MED ORDER — TECHNETIUM TC 99M MEBROFENIN
Freq: Once | Status: AC | PRN
Start: 2016-04-19 — End: 2016-04-19
  Administered 2016-04-19: 16:00:00 6 via INTRAVENOUS

## 2016-04-26 ENCOUNTER — Inpatient Hospital Stay
Admit: 2016-04-26 | Discharge: 2016-04-26 | Disposition: A | Discharge status: Home / Self Care | Attending: Emergency Medicine

## 2016-04-26 DIAGNOSIS — R112 Nausea with vomiting, unspecified: Secondary | ICD-10-CM

## 2016-04-26 MED ORDER — ONDANSETRON 4 MG PO TBDP
4 MG | ORAL_TABLET | Freq: Three times a day (TID) | ORAL | 0 refills | Status: AC | PRN
Start: 2016-04-26 — End: ?

## 2016-04-26 MED ORDER — ONDANSETRON 4 MG PO TBDP
4 MG | Freq: Once | ORAL | Status: AC
Start: 2016-04-26 — End: 2016-04-26
  Administered 2016-04-26: 17:00:00 4 mg via ORAL

## 2016-04-26 MED FILL — ONDANSETRON 4 MG PO TBDP: 4 MG | ORAL | Qty: 1

## 2016-04-26 NOTE — ED Notes (Signed)
janelle called on pt, pt condition updated, pt had 1 case of emesis, md palmer aware.      Jacolyn Reedy, RN  04/26/16 [phone]

## 2016-04-26 NOTE — ED Notes (Signed)
Pt had 1 case of emesis, pt requestin gwater, pt requesting food. Pt given apple and fruit, pt given ice water.      Jacolyn Reedy, RN  04/26/16 [phone]

## 2016-04-26 NOTE — ED Provider Notes (Signed)
-    [facility]  EMERGENCY DEPARTMENT ENCOUNTER          ATTENDING PHYSICIAN NOTE       Date of evaluation: 04/26/2016    Chief Complaint     Nursing Notes, Past Medical Hx, Past Surgical Hx, Social Hx, Allergies, and Family Hx were reviewed.    Emesis and Nausea      History of Present Illness     Terri Booth is a 65 y.o. female who presents to emergency department from her physician's office for nausea and vomiting.  Per the patient's report she has had nausea and vomiting that's been nonbloody and nonbilious almost every meal for the past 8 months.  This is been worked up extensively with inpatient hospital admissions, ER visits, and outpatient GI visits in testing.  She has had CT scan, ultrasound, and high to scan all of which have been fairly unremarkable other than some gallbladder sludge.  The patient denies previous abdominal significant surgeries and has no abdominal pain during these episodes.  She states it happens 2-3 times per day daily.  She states that this morning she was asked to eat prior to her GI appointment and at the GI appointment proximally one to 2 hours later she had nonbilious nonbloody emesis of food contents which is typically what happens in the same time frame on a daily basis.  Her physician at the office wanted her sent into the emergency department because of this.  On my history the patient actually has no complaints.  She says she has no abdominal pain.  She is currently not nauseous.  She has no fevers or chills.  She says she feels fine.  She states she would like to go home.    Review of Systems     Patient specifically denies fever, chest pain, short of breath.  Please see HPI for additional pertinent positives and negatives.  All other systems were reviewed and were negative.      Past Medical, Surgical, Family, and Social History     She has a past medical history of Anemia; Anxiety; Arthritis; CAD (coronary artery disease); COPD (chronic obstructive pulmonary  disease) (HCC); Diabetes mellitus (HCC); GERD (gastroesophageal reflux disease); Gout; Hyperlipidemia; Hypertension; Hypo-osmolality and hyponatremia; Non-STEMI (non-ST elevated myocardial infarction) (HCC); Obese; and UTI (urinary tract infection).  She has a past surgical history that includes Leg amputation below knee (Left); joint replacement (Right); and knee surgery.  Her family history is not on file.  She reports that she has never smoked. She does not have any smokeless tobacco history on file. She reports that she does not drink alcohol or use illicit drugs.    Medications     Previous Medications    ACETAMINOPHEN (TYLENOL) 325 MG TABLET    Take 650 mg by mouth every 4 hours as needed for Pain or Fever    ALLOPURINOL (ZYLOPRIM) 300 MG TABLET    Take 300 mg by mouth daily    BENZOCAINE-MENTHOL (CEPACOL) 6-10 MG LOZG LOZENGE    Take 1 lozenge by mouth every 4 hours as needed for Sore Throat    BISACODYL (DULCOLAX PO)    Take 5 mg by mouth daily as needed     BISACODYL (DULCOLAX) 10 MG SUPPOSITORY    Place 10 mg rectally daily as needed for Constipation    CALCIUM CARBONATE (TUMS) 500 MG CHEWABLE TABLET    Take 1 tablet by mouth every 4 hours as needed for Heartburn  CEFADROXIL (DURICEF) 500 MG CAPSULE    Take 500 mg by mouth 2 times daily    CYCLOBENZAPRINE (FLEXERIL) 10 MG TABLET    Take 10 mg by mouth 3 times daily as needed for Muscle spasms    DIPHENHYDRAMINE (BENADRYL) 25 MG TABLET    Take 25-50 mg by mouth every 6 hours as needed for Itching    DOCUSATE SODIUM (COLACE) 100 MG CAPSULE    Take 100 mg by mouth daily    ERGOCALCIFEROL (ERGOCALCIFEROL) 50000 UNITS CAPSULE    Take 50,000 Units by mouth once a week    FENTANYL (DURAGESIC)    Place 1 patch onto the skin every 72 hours 75 mcq/hr    FLUTICASONE (FLONASE) 50 MCG/ACT NASAL SPRAY    2 sprays by Nasal route daily    FUROSEMIDE (LASIX) 40 MG TABLET    Take 40 mg by mouth 2 times daily    GABAPENTIN (NEURONTIN) 400 MG CAPSULE    Take 400 mg by mouth  3 times daily    HYDROCODONE-ACETAMINOPHEN (NORCO) 5-325 MG PER TABLET    Take 1 tablet by mouth every 4 hours .    INSULIN GLARGINE (LANTUS) 100 UNIT/ML INJECTION VIAL    Inject 18 Units into the skin nightly    IPRATROPIUM-ALBUTEROL (DUONEB) 0.5-2.5 (3) MG/3ML SOLN NEBULIZER SOLUTION    Inhale 1 vial into the lungs every 4 hours    LOPERAMIDE (IMODIUM) 2 MG CAPSULE    Take 2 mg by mouth 4 times daily as needed for Diarrhea    LORATADINE-PSEUDOEPHEDRINE (CLARITIN-D 24 HOUR) 10-240 MG PER EXTENDED RELEASE TABLET    Take 1 tablet by mouth daily    MAGNESIUM HYDROXIDE (MILK OF MAGNESIA) 400 MG/5ML SUSPENSION    Take 30 mLs by mouth daily as needed for Constipation    MAGNESIUM OXIDE 400 MG CAPS    Take 800 mg by mouth daily    MELATONIN 3 MG TABS TABLET    Take 5 mg by mouth daily as needed    MELOXICAM (MOBIC) 7.5 MG TABLET    Take 7.5 mg by mouth daily    MENTHOL, TOPICAL ANALGESIC, 4 % GEL    Apply 1 Dose topically every 8 hours as needed Right shoulder and knees    MENTHOL-ZINC OXIDE (LANTISEPTIC MULTI-PURPOSE) 0.45-20 % OINT    Apply 1 Dose topically as needed After peri care    METFORMIN (GLUCOPHAGE) 1000 MG TABLET    Take 1,000 mg by mouth 2 times daily (with meals)    METOPROLOL TARTRATE (LOPRESSOR) 50 MG TABLET    Take 50 mg by mouth 2 times daily    MINOCYCLINE (MINOCIN;DYNACIN) 100 MG CAPSULE    Take 100 mg by mouth 2 times daily    OMEPRAZOLE (PRILOSEC) 20 MG DELAYED RELEASE CAPSULE    Take 20 mg by mouth daily    POLYETHYLENE GLYCOL (GLYCOLAX) POWDER    Take 17 g by mouth daily as needed    POTASSIUM & SODIUM PHOSPHATES (PHOS-NAK) 280-160-250 MG PACK    Take 1 packet by mouth 2 times daily     PROMETHAZINE (PHENERGAN) 25 MG SUPPOSITORY    Place 25 mg rectally every 6 hours as needed for Nausea    SIMETHICONE (MYLICON) 80 MG CHEWABLE TABLET    Take 80 mg by mouth every 6 hours as needed for Flatulence    SITAGLIPTIN (JANUVIA) 50 MG TABLET    Take 50 mg by mouth daily    SODIUM PHOSPHATES (TGT SALINE  LAXATIVE) ENEM    Place 1 Dose rectally as needed    SPIRONOLACTONE (ALDACTONE) 50 MG TABLET    Take 50 mg by mouth daily       Allergies     She is allergic to latex; benadryl [diphenhydramine]; betadine [povidone iodine]; cefazolin; codeine; morphine; penicillins; sulfa antibiotics; and tape [adhesive tape].    Physical Exam     INITIAL VITALS: BP: 117/61, Temp: 98 ??F (36.7 ??C), Pulse: 99, Resp: 16, SpO2: 100 %   Physical Exam    INITIAL VITALS: BP 117/61   Pulse 99   Temp 98 ??F (36.7 ??C) (Oral)    Resp 16   SpO2 100%   Breastfeeding? No     General:  Well-developed, no acute distress  HEENT: Atraumatic, normocephalic. PERRLA, EOMI, oropharynx clear and moist  Neck: No meningismus, no JVD  Chest: CTA B/L without wheezing, rales or rhoncii  Cardiovascular: RRR, Normal S1/S2, no M/R/G.  No edema.  Intact peripheral pulses  Abdomen: Soft, nontender, nondistended.  No guarding, rebound, or HSM  Musculoskeletal: No joint deformity area she has a amputation of the left lower extremity  GU: Deferred  Neurologic: AAO x 4, fluent speech without dysarthria or aphasia, moves all extremities symmetrically with equal strength.  Skin: No obvious rashes, petechiae, or purpura  Psych: Appropriate    Diagnostic Results     EKG       RADIOLOGY:  No orders to display       LABS:   No results found for this visit on 04/26/16.    ED BEDSIDE ULTRASOUND:      RECENT VITALS:  BP: 117/61, Temp: 98 ??F (36.7 ??C), Pulse: 99, Resp: 16, SpO2: 100 %     Procedures         ED Course     Nursing Notes, Past Medical Hx, Past Surgical Hx, Social Hx, Allergies, and Family Hx were reviewed.    The patient was given the following medications:  Orders Placed This Encounter   Medications   ??? ondansetron (ZOFRAN-ODT) disintegrating tablet 4 mg   ??? ondansetron (ZOFRAN-ODT) 4 MG disintegrating tablet     Sig: Take 1 tablet by mouth every 8 hours as needed for Nausea or Vomiting     Dispense:  15 tablet     Refill:  0       CONSULTS:  None    MEDICAL  DECISION MAKING / ASSESSMENT / PLAN     Cala Cabera is a 65 y.o. female presenting to the [facility] Emergency Department today with Emesis and Nausea    .  Upon appropriate History, Physical Exam, and ancillary study information performed in JB15/B15, at this time my clinical assessment of her presentation is most consistent with nonspecific nausea and vomiting.  The patient is a well-appearing female who had her typical nonbilious nonbloody emesis while at a physician's office today and was sent in for evaluation.  There was no call head therefore it's unclear what the request for sending into the emergency department is as the patient does not want to be here.  She states as above per history of present illness that this is something that is been happening chronically with meals and is being worked up as an outpatient.  Her vital signs are reassuring.  Her abdominal exam is reassuring without pain.  She has no occult source on history or physical exam of nausea and vomiting such as an occult acute coronary syndrome and she has no chest pain  or shortness of breath and given that this is been going on for months and appendectomy in similar character and quality quality today my suspicion that this is an occult ACS event or other infectious process is very low.  I offered the patient is Zofran ODT which he accepted.  She also stated that she typically will vomit up her pills that are prescribed therefore I'll prescribe her ODT's because she acted the Zofran ODT in the emergency department.  At this point the patient elected to be discharged home at believe this is appropriate without any significant workup given her extensive inpatient and outpatient workups to this point I don't believe there is any further ED interventions or workup that needs to be obtained nor does she need hospital admission for this.  I gave my customary discharge instructions return precautions for nausea and vomiting.    The patient was in  agreement with the evaluation and management discussed at the bedside, and after verbal and written discharge instructions were provided was able to confirm understanding of follow up and return precautions.          Clinical Impression     Nausea and vomiting    Disposition     Discharge         Gust Rung III, MD  04/26/16 1318

## 2016-04-26 NOTE — ED Notes (Signed)
Patient prepared for discharge and return to extended care facility. Discharge instructions given to transport crew. Pt verbalized understanding of discharge instructions and return to extended care facility.   Pt given rx and had no questions, pt understood dx and tx plan.    Report to casey from first care. Pt transferred to stretcher.          Jacolyn Reedy, RN  04/26/16 [phone]

## 2016-04-26 NOTE — ED Notes (Signed)
Rounded on pt, pt reading. Pt inquring on squad arrival time.      Jacolyn Reedy, RN  04/26/16 1740

## 2016-04-26 NOTE — ED Notes (Signed)
Bed: B15  Expected date:   Expected time:   Means of arrival:   Comments:  1 - [facility] [address] [place]  04/26/16 1252

## 2016-04-26 NOTE — ED Notes (Signed)
First care to tranpsort pt, brenda given eta of 5-530. (1700-1730)     Jacolyn Reedy, RN  04/26/16 1407

## 2016-04-26 NOTE — ED Triage Notes (Signed)
Pt into gown and on cardiac mnoitr, dr palmer to bedside, pt reports she has been throwing up for the past 8 months, and she ate breatkfast today, which increases the chances of her throwing up, pt was in dr atars office, when she threw up and was sent by private squad to go to the ED. Pt denies pain.

## 2016-05-23 NOTE — Unmapped (Signed)
Progress Notes by Annye Asa., MD at 05/23/16 1245     Author:  Annye Asa., MD Service:  Cardiology Author Type:  Physician    Filed:  05/23/16 1246 Date of Service:  05/23/16 1245 Status:  Signed    Editor:  Annye Asa., MD (Physician)         Cardiology Consult received.  Repeat echo ordered - will get done tomorrow Monday.  Full consult note to follow after that from Clinical Cardiology Service.  Thank you.[TO.1]       Electronically signed by Annye Asa., MD on 05/23/16 1246.   Attribution Key    TO.1 - Annye Asa., MD on 05/23/16 1245

## 2017-12-16 NOTE — Unmapped (Addendum)
Op Note signed by Alanda Amass., MD at 12/19/17 1607      Author:  Alanda Amass., MD  Service:  Cardiology  Author Type:  Physician    Filed:  12/19/17 1607  Date of Service:  12/16/17 1316  Status:  Signed    Editor:  Alanda Amass., MD (Physician)    Trans ID:  [id number]    Dictation Time:  12/16/17 1316 Trans Time:  12/16/17 2333 Trans Doc Type:  OP Note Trans Status:  Available                                   [facility]    PATIENT NAME: Alexis Luna, Alexis Luna                               MR#: [id number]  DATE OF BIRTH: 01/04/1951                           Account #: [id number]  ADMITTING: Annett Fabian                          ROOM #: Alexis.Gib[facility]  ATTENDING: Annye Asa                      NURSING UNIT: CVSU  PRIMARY: Blossom Hoops E                       ADMIT DATE: 12/06/2017  REFERRING: Lendon Ka E                  DISCHARGE DATE:   DICTATED BY: Yadiel Aubry J                               OPERATIVE REPORT    DATE OF OPERATION: 12/16/17    REFERRING PHYSICIAN:  Dr. Dorthula Rue.    PROCEDURE PERFORMED:  Implantation of the leadless pacemaker.    INDICATIONS FOR PROCEDURE:  Complete heart block.    POSTOPERATIVE DIAGNOSIS:  Complete heart block.    ESTIMATED BLOOD LOSS:  Minimal.    SPECIMENS TAKEN:  None.    SURGICAL ASSISTANT:  None.    HISTORY OF PRESENT ILLNESS:  This is a 67 year old woman who presented to [facility] on March [facility]th with a cardiac arrest due to complete heart block with a nonreversible symptomatic bradycardia.  She was urgently taken to cardiac catheterization   laboratory where she underwent a temporary pacemaker implantation.  Cardiac catheterization revealed advanced graft and native vessel coronary disease.  The patient had cardiogenic shock, requiring an intraaortic balloon pump and pressure support and   respiratory arrest requiring intubation and sedation.  She has had a difficult recovery with ongoing infections relating to orthopedic  procedures and difficult recovery from cardiogenic shock.  The patient required a pacemaker support and was felt not to   be a candidate at this time for ICD implantation, given her ongoing infections, it was felt most appropriate to place a leadless pacemaker.    PROCEDURE NOTE:  The patient was brought to electrophysiology laboratory in the fasting state after signing informed consent.  The right groin was prepped and draped in the  usual sterile fashion.  Local anesthesia was achieved over the right femoral   vessels.  With the assistance of Dr. Cindi Carbon, we performed a right femoral vein puncture using ultrasound guidance and a micropuncture kit.  Venous access was straightforward and confirmed appropriate with ultrasound and fluoroscopy guidance.  We then   sized up for an Amplatz wire and then performed progressive dilation of the femoral vein using the dilator kits and ultimately placed the Micra sheath over the long Amplatz wire into the right atrium.  Through this sheath, we then advanced the Micra   introducer sheath and positioned this in the right ventricular apex under fluoroscopy guidance.  A dye injection was performed confirming a good apposition of the sheath to the myocardial wall at the RV apex.  We then deployed the leadless pacemaker and   pulled back the sheath and tethered.  We confirmed good fixation of the Micra pacemaker using the tug test, showing at least 2 times or well fixed and possibly more.  We then performed electrical testing of the leadless pacemaker and found it to function   properly.  After confirming stable location and good function of the device, we then cut the tether and confirmed ongoing good pacer function, we then removed all of the introducer sheath hardware and achieved good hemostasis with a pursestring Ethibond   suture and digital pressure.  The patient then left the electrophysiology laboratory in stable condition.    LEADLESS PACEMAKER DEVICE INFORMATION:   1.   Leadless pacemaker Medtronic Micra model  Lewis, serial number [id number], pacing mode VVI, rate 50,  R-wave amplitude 20.0 mV, pacing impedance 920 ohms, pacing threshold 0.6 millivolts.    DISCUSSION:  This is a 67 year old woman who presents with a complete heart block and a cardiac arrest in the setting of cardiogenic shock, she has multiple medical comorbidities and requires pacing support.  Today, we have placed a Medtronic Micra   single-chamber leadless pacemaker via right femoral vein access.  The patient will receive routine post-procedure monitoring and ongoing care in the hospital and we will reassess her candidacy for ICD implantation in the future pending treatment of all   of her ongoing infections and recovery from her cardiogenic.      Dict: *Staysha Truby J. Sherard Sutch. MD  Auth: Rex Kras. Dorathy Stallone. MD  D: 12/16/2017 13:16:55  T: 12/16/2017 23:33:50  Orig. Job# [id number]  Dictation ID: [id number]  [ES.1]   Electronically signed by Alanda Amass., MD on 12/19/17 1607.   Attribution Key    ES.1 - Alanda Amass., MD on 12/16/17 2334

## 2017-12-17 ENCOUNTER — Encounter: Attending: Cardiovascular Disease

## 2017-12-17 NOTE — Unmapped (Addendum)
Progress Notes by Annye Asa., MD at 12/17/17 1757     Author:  Annye Asa., MD Service:  Cardiology Author Type:  Physician    Filed:  12/17/17 1800 Date of Service:  12/17/17 1757 Status:  Signed    Editor:  Annye Asa., MD (Physician)         Advanced HF Progress Note    Chief complaint: fatigue    Subjective     Denies SOB or CP at rest  Uneventful Leadless PPM placement yesterday  Some concern about groin bleeding last night - stable this am    Assessment     Cardiogenic shock post PEA/CHB arrest  Bradycardia s/p temp pacer with CRT-P  Severe AS s/p BAV on 12/08/17  Ischemic Cardiomyopathy LVEF 30-35%  CAD s/p CABG  S/p L BKA  H/o prosthetic joint infection  Morbid obesity  IDDM  Mod-Severe cognitive dysfunction     Plan     Micra leadless pacemaker yesterday - functioning well,   Hgb stable, groin ok  Apprec EP help - ok for d/c per their standpoint  SW arranging dispo back to home facility hopefully today    ROS:   Positive findings as outlined in HPI.    Objective     BP 99/44    Pulse (!) 105    Temp 97.3 ????F (36.3 ????C) (Oral)    Resp 17    Ht 5' 2.99 (1.6 m)    Wt 275 lb 1.6 oz (124.8 kg)    SpO2 (!) 88%    BMI 48.74 kg/m????     Intake/Output Summary (Last 24 hours) at 12/17/17 1757  Last data filed at 12/17/17 0454   Gross per 24 hour   Intake              340 ml   Output                2 ml   Net              338 ml       Wt Readings from Last 3 Encounters:    12/17/17 275 lb 1.6 oz (124.8 kg)   09/23/17 245 lb (111.1 kg)   08/24/17 245 lb (111.1 kg)     Recent Weights:       12/15/17 0600 12/16/17 0352 12/17/17 0454   Weight: 284 lb 2.8 oz (128.9 kg) 282 lb 9.6 oz (128.2 kg) 275 lb 1.6 oz (124.8 kg)     Constitutional: NAD  CV: RRR. nl S1 S2.  2/6 systolic mrumur  JVP 7 cm H2O.  trace edema  Resp: Clear to auscultation bilaterally  GI: soft, NT, no organomegaly, non-distended      Recent Labs         12/15/17  0431  12/16/17  0412  12/17/17  0408   NA  135  138  135   K  4.7  4.6   4.4   CO2  28  31*  32*   PHOS  3.1  3.5  3.6   BUN  17  22  23    CREATININE  0.76  0.84  0.85     Recent Labs         12/15/17  0431  12/16/17  0412  12/17/17  0408   WBC  9.4  8.6  10.0   HGB  9.4*  9.3*  8.7*   HCT  29.6*  29.3*  26.8*  MCV  82.5  81.3  79.8*   PLT  265  304  326     Lab Results      Component  Value Date    CKTOTAL 44 10/09/2014    CKTOTAL 48 10/09/2014    CKTOTAL 50 10/09/2014    TROPONINI 14.69 (HIGH) 12/08/2017    TROPONINI 15.21 (HIGH) 12/08/2017    TROPONINI 15.12 (HIGH) 12/07/2017[TO.1]             Electronically signed by Annye Asa., MD on 12/17/17 1800.   Attribution Key    TO.1 - Annye Asa., MD on 12/17/17 [phone]

## 2017-12-17 NOTE — Unmapped (Addendum)
Discharge Summaries by Daisy Floro., NP at 12/17/17 1120     Author:  Daisy Floro., NP Service:  Cardiology Author Type:  Nurse Practitioner    Filed:  12/02/2017 1510 Date of Service:  12/17/17 1120 Status:  Attested    Editor:  Daisy Floro., NP (Nurse Practitioner)  Cosigner:  Annye Asa., MD at 11/29/2017 1929    Attestation signed by Annye Asa., MD at 12/20/2017 1929   LATE ENTRY FOR SERVICE PROVIDED 12/17/17  I agree with the attached note, including the Assessment and Plan. Briefly - Pt exam with S1S2, intact periph pulses. Lungs generally clear. Patient   appropriate for discharge, with close outpt f/u arranged.              Discharge Summary[AP.1]      Alexis Luna  [id number]  67 y.o.  1951-01-24[AP.2]    Admit date:[AP.1] 12/06/2017[AP.2]    Discharge date and time:[AP.1] 12/17/17[AP.3]    Admitting Physician:[AP.1] Annett Fabian, DO[AP.2]     Discharge Physician:[AP.1] Clinton Sawyer, MD[AP.3]    Admission Diagnoses:[AP.1] CARDIAC ARREST  NSTEMI (non-ST elevated myocardial infarction) (CMS [facility])  NSTEMI (non-ST elevated myocardial infarction) (CMS [facility])[AP.2]    Discharge Diagnoses:[AP.1]     Cardiogenic shock post PEA/CHB arrest  S/p IABP 3/12-3/15  CHF s/p leadless pacer (medtronic) 3/22  Severe AS s/p BAV on 12/08/17  Ischemic Cardiomyopathy LVEF 30-35%  CAD s/p CABG  S/p L BKA  H/o prosthetic joint infection  Morbid obesity  IDDM  Mod-Severe cognitive dysfunction[AP.3]     Admitting history:[AP.1]    68 y.o. female with history of CAD s/p 3V CABG (LIMA-LAD, SVG-OM, SVG-RCA), known occluded graft to OM. Ejection fraction 20-25% from ECHO in 2017. History of LBKA, HPL, COPD, morbid obesity, currently resides in a nursing facility. Today she reported   chest pain while at the nursing home. She was transported to a local emergency room.                  While in the outside ED she was found to be bradycardia with HR as low as 20 and had periodic asystole. Per report she received CPR for about  6-8 minutes and then responded appropriately. She was not intubated. Initial troponin was   elevated at 6. She was given atropine as well as dopamine gtt and transferred to [facility] for further management.                  On arrival to [facility] she was found to be bradycardic and was pale and complaining of nausea. She was intubated emergently. She had 1 working peripheral IV. Rhythm appeared showed wide complex bradycardia. Pressors were uptitrated and she was   stabilized as able prior to being taken to the cath lab.   ????[AP.3]    Hospital Course:[AP.1]     Pt had right fem IABP and temp pacer 3/12 and right and left heart cath. 3/14 she had a balloon aortic valvuloplasty. IABP was removed 3/15. Pt had tube feeds through a corpak at one point. 3/18 she passed a modified barium swallow test.  3/22 pt had a   leadless pacer (medtronic) placed for CHB. After the procedure she had some issues with bleeding from her groin site, but it resolved and sutures were removed by EP. Her Hgb was 8.7 on d/c. Her iron stores were low, she was given a dose of IV venofer   prior to d/c. Pt was discharged to  [facility]. Interdry was placed in her groins, due to morbid obesity and skin folds. She had had growth of CONS in one set of blood cultures 3/16 and was seen by ID, who ruled that it was contaminant. No   other current infectious issues.[AP.3]     Active issues for FOCUS[AP.1]    HH[AP.3]      Significant Diagnostic Studies:[AP.1]     12/15/17 limited echo  - Left ventricle: Not well visualized. The cavity size was normal.  ????????There was mild concentric hypertrophy. Systolic function was  ????????moderately reduced. The calculated ejection fraction was in the  ????????range of 30% to 35%. Moderate diffuse hypokinesis. Regional wall  ????????motion abnormalities cannot be excluded. Stroke volume: 72ml.  ????????Stroke volume/bsa: 35ml/m^2.  - Aortic valve: Cusp separation was reduced. Transvalvular velocity  ????????was increased. There was mild  to moderate stenosis. The peal  ????????velocity, mean and peak gradients are significantly lower than  ????????those noted on the study done 12/07/2017. This is likely due to  ????????the inability of the patient to cooperate with the study. There  ????????was trivial regurgitation. Mean gradient (S): 17mm Hg. Peak  ????????gradient (S): 27mm Hg. Valve area (VTI): 1.45cm^2. Indexed valve  ????????area (VTI): 0.65cm^2/m^2.  - Mitral valve: The annulus was markedly calcified. The leaflets  ????????were mildly thickened and mildly calcified.  - Right ventricle: Pacer wire noted in right ventricle. Systolic  ????????function was reduced.    ????12/06/17 right and left heart cath  Findings:   ????  Right heart catheterization and Hemodynamics ???? ???? ???? ???? ???? ????   Right atrium 29 / 29 / 28 ????   Right ventricle 62 / 26 / 27 ????   Pulmonary artery 56 / 27 / 35 ????   Pulmonary capillary wedge  28 / 41 / 30 ????   ???? ???? ???? ???? ???? ???? ????   Left ventricle 140 / 25 / 35 ????   Aorta 115 / 64 / 81 ????   ???? ???? ???? ???? ???? ???? ????   ????  ???? Cardiac output (L/m) Cardiac index (L/m/m2) Systemic vascular resistance (Dynes) Pulmonary vascular resistance Joseph Art) ????   Fick 4.9 2.2 972 1 ????   ???? ???? ???? ???? ???? ????   Saturation Right atrium NA ???? ???? ????   ???? Pulmonary artery 57 ???? ???? ????   ???? Femoral artery 95 ???? ???? ????   ???? ???? ???? ???? ???? ????   ???? FiO2 RA ???? ???? ????   ????  ????  ????  Additional comments:  ????  Left ventriculography and Hemodynamics ????   LVEDP 34 mmHg   Estimated Ejection Fraction Wall motion Not performed to reduce contrast load   Valve function No significant AS   ???? ????   Coronary angiography ????   Dominance Right   Left Main Diffusely diseased 70% with distal 90%   Left Anterior Descending Proximal Diffuse 80% with mid-vessel occlusion   Left Circumflex Ostial occlusion. Nondominant. Faint R-L collaterals from right PL with retrograde filling of distal OM branch   Right Occluded. Dominant.   ???? ????   Coronary bypass graft angiography ????   SVG to RCA Mid-vessel 70% but not significantly  changed from 2016 angio. Compared to distal RCA runoff vessel, only 50% stenosis. R-L collaterals from right PL with retrograde filling of the distal OM   LIMA to LAD Large caliber and widely patent with brisk distal runoff in the LAD   ???? ????   ???? ????   ???? ????   ????  ????  Additional comments: To CVICU for further management. Critically ill but stable.[AP.3]        Discharge Exam:[AP.1]  BP 109/61    Pulse 86    Temp 97.4 ????F (36.3 ????C) (Oral)    Resp 18    Ht 5' 2.99 (1.6 m)    Wt 275 lb 1.6 oz (124.8 kg)    SpO2 97%    BMI 48.74 kg/m????     Intake/Output Summary (Last 24 hours) at 12/17/17 1121  Last data filed at 12/17/17 0454   Gross per 24 hour   Intake              580 ml   Output                2 ml   Net              578 ml[AP.2]         General appearance:[AP.1] alert, cooperative, no distress, appears stated age[AP.3]  HEENT: WNL  Neck:[AP.1] supple, symmetrical, trachea midline, no adenopathy, thyroid: not enlarged, symmetric, no tenderness/mass/nodules, no carotid bruit and no JVD[AP.3]  Lungs:[AP.1] clear to auscultation bilaterally[AP.3]  Chest wall:[AP.1] no tenderness[AP.3]  Heart:[AP.1] regular rate and rhythm, S1, S2 normal,[AP.3]  Abdomen:[AP.1] soft, non-tender. Bowel sounds normal. No masses,  no organomegaly[AP.3]  Extremities:[AP.1] left BKA[AP.3]  Pulses:[AP.1] 1+ and symmetric[AP.3]    Labs on discharge[AP.1]  Lab Results      Component  Value Date    NA 135 12/17/2017    K 4.4 12/17/2017    CO2 32 (HIGH) 12/17/2017    PHOS 3.6 12/17/2017    BUN 23 12/17/2017    CREATININE 0.85 12/17/2017      Lab Results      Component  Value Date    WBC 10.0 12/17/2017    HGB 8.7 (LOW) 12/17/2017    HCT 26.8 (LOW) 12/17/2017    MCV 79.8 (LOW) 12/17/2017    PLT 326 12/17/2017     Lab Results      Component  Value Date    INR 1.0 12/17/2017    INR 1.1 12/16/2017    INR 1.0 12/15/2017    PROTIME 12.0 12/17/2017    PROTIME 12.6 12/16/2017    PROTIME 11.7 12/15/2017     Lab  Results      Component  Value Date    BILITOT 0.4 12/08/2017    BILIDIR 0.2 12/08/2017    AST 52 (HIGH) 12/08/2017    ALT 67 (HIGH) 12/08/2017    ALKPHOS 162 (HIGH) 12/08/2017    ALB 3.0 (LOW) 12/08/2017     Lab Results      Component  Value Date    CKTOTAL 44 10/09/2014    TROPONINI 14.69 (HIGH) 12/08/2017[AP.2]       Disposition:[AP.1] long term care facility[AP.3]    Patient Instructions:[AP.1]     Patient IS currrently admitted.[AP.3]   Discharge Medication List as of 12/17/2017  5:42 PM      START taking these medications     Details   metoprolol succinate (TOPROL) 25 mg XL tablet Take 0.5 Tabs by mouth nightly at bedtime.Disp-30 Tab, R-0, No Print       torsemide (DEMADEX) 20 mg tablet Take 1 Tab by mouth daily.Disp-30 Tab, R-0, No Print           CONTINUE these medications which have NOT CHANGED     Details   acetaminophen (TYLENOL) 325 mg tablet Take 650 mg by mouth every 4 hours  as needed for Pain or Fever.Historical Med       allopurinol (ZYLOPRIM) 300 mg tablet Take 300 mg by mouth daily.Historical Med       alum - mag hydroxide-simeth (MINTOX MAX) 400-400-40 mg/5 mL Suspension Take 30 mL by mouth every 8 hours as needed (GIVE 30 ML BY MOUTH EVERY 8 HOURS AS NEEDED FOR UPSET STOMACH/INDIGESTION).Historical Med       aspirin 81 mg tablet Take 1 Tab by mouth daily.Disp-100 Tab, R-0, Print       atorvastatin (LIPITOR) 40 mg Tablet Take 1 Tab by mouth nightly at bedtime.Disp-30 Tab, R-6, Print       bisacodyl (DULCOLAX) 5 mg EC tablet Take 1 Tab by mouth daily as needed for Constipation.Disp-30 Tab, R-0, No Print       bisacodyl (DULCOLAX, BISACODYL,) 10 mg suppository Insert 10 mg into rectum daily as needed.Historical Med       calcium carbonate (TUMS EX) 300 mg (750 mg) Tablet, Chewable Take  by mouth every 4 hours as needed.Historical Med       cyclobenzaprine (FLEXERIL) 10 mg tablet Take 10 mg by mouth every 8 hours as needed for muscle spasm.Historical Med       dextrose (GLUCOSE GEL) 40 % Gel Take 15 g  by mouth once. GIVE 1 APPLICATION AS NEEDED FOR HYPOGLYCEMIA-ALERT AND ABLE TO SWALLOW ADMINISTER FIRST DOSE. IF AFTER 15 MIN S/S CONTINUE ADMINISTER 2ND DOSE. IF AFTER 30 MINUTES S/S CONTINUE TO MOVE TO   INJECTION ORDER.Historical Med       diphenhydrAMINE (BENADRYL) 25 mg tablet Take 25-50 mg by mouth every 6 hours as needed for Sleep.Historical Med       docusate sodium (DULCOLAX STOOL SOFTENER, DSS,) 100 mg capsule Take 100 mg by mouth daily.Historical Med       ergocalciferol (ERGOCALCIFEROL) 50,000 unit Capsule Take 1 Cap by mouth every 7 days.Disp-4 Cap, R-0, No Print       ferrous sulfate 325 mg (65 mg iron) EC tablet Take 325 mg by mouth daily.Historical Med       gabapentin (NEURONTIN) 400 mg capsule Take 1 Cap by mouth 3 times daily.Disp-12 Cap, R-0, Print       glucagon, human recombinant, (GLUCAGON EMERGENCY KIT, HUMAN,) 1 mg Recon Soln 1 mg once. AS NEEDED FOR HYPOGLYCEMIA-UNRESPONSIVE AND UNABLE TO SWALLOW ADMINISTER AND CONTACT MD IMMEDIATELYHistorical Med       insulin aspart U-100 (NOVOLOG) 100 unit/mL Solution by Subcutaneous route. SLIDING SCALE WITH MEALS: 150-400=12 UNITS                                                           401-500=14 UNITS                                                             501+=16 UNITS AND CALL MDHistorical Med       Insulin Detemir (LEVEMIR) 100 unit/mL (3 mL) insulin pen 25 Units by Subcutaneous route. EVERY MORNING AND AT BEDTIME FOR DMHistorical Med       lanolin (LANTISEPTIC SKIN PROTECTANT) 50 % Cream Apply  topically. APPLY  TO COCCYX TOPICALLY EVERY DAY AND NIGHT SHIFT FOR OPEN AREA UNTIL HEALEDHistorical Med       Loperamide (IMODIUM A-D) 2 mg tablet Take 2 mg by mouth as needed for Diarrhea.Historical Med       loratadine (CLARITIN) 10 mg tablet Take 10 mg by mouth daily.Historical Med       MAGNESIUM HYDROXIDE (MILK OF MAGNESIA PO) Take 30 mL by mouth daily as needed.Historical Med       magnesium oxide (MAG-OX) 400 mg tablet Take 2 Tabs by mouth 2  times daily.Disp-30 Tab, R-0, No Print       menthol (BIOFREEZE, MENTHOL,) 4 % Gel Apply  topically every 8 hours as needed.Historical Med       metFORMIN (GLUCOPHAGE) 1,000 mg tablet Take 1,000 mg by mouth 2 times daily (with meals).Historical Med       nystatin (MYCOSTATIN) 100,000 unit/gram cream Apply  topically daily. APPLY AROUND ULCER ON R HIP TOPICALLY ONE TIME A DAY FOR CANDIDA. DO NOT APPLY DIRECTLY ON ULCER. APPLY TO CANDIDA IN SKIN FOLDS.Historical Med       omeprazole (PRILOSEC) 20 mg capsule Take 20 mg by mouth daily.Historical Med       polyethylene glycol (MIRALAX) 17 gram/dose powder Take 17 g by mouth daily. MIX WITH 4-8 OZ FLUID OF CHOICEHistorical Med       promethazine (PHENERGAN) 12.5 mg tablet Take 12.5 mg by mouth every 6 hours as needed for Nausea.Historical Med           STOP taking these medications           DM-benzocaine-menthol (CHLORASEPTIC TOTAL) 01-30-09 mg Lozenge Comments:  Reason for Stopping:          Hydrocodone-Acetaminophen (NORCO) 5-325 mg per tablet Comments:  Reason for Stopping:          OTHER - SEE EPIC ADMIN INSTRUCTIONS Comments:  Reason for Stopping:          rivaroxaban (XARELTO) 10 mg Tablet Comments:  Reason for Stopping:[AP.4]                    Activity:[AP.1] activity as tolerated[AP.3]    Follow-up with[AP.1] Dory Peru NP[AP.3]    HF patients (delete if NA):    1. Pt on ACEI/ARB?  __[AP.1]no[AP.3]______.   If NO, then document reason____[AP.1]hypotension[AP.3]______  2. Beta blocker? ____[AP.1]yes[AP.3]____.  If NO, then document reason __________  3. EF: _____________[AP.1]30-35[AP.3]____  4.  Smoking cessation counseling provided  5. HF education: provided per RN      Admission NYHA class _[AP.1]IV[AP.3]____  Discharge NYHA class __[AP.1]II[AP.3]___    Patient was educated regarding the potential complications of non-adherence to prescribed medical and lifestyle regimens.  The patient's functional class has improved and symptoms stabilized to the point where  in the attending physician's clinical   judgment, the pt could be discharged.      Total time spent discharging  this patient:[AP.1] 35 min[AP.3]    Signed:[AP.1]        Anne H. Dorthula Rue, NP  12/17/2017  11:21 AM[AP.2]       Electronically signed by Annye Asa., MD on 12/19/2017 1929.   Attribution Key    AP.1 - Daisy Floro., NP on 12/17/17 1120  AP.2 - Daisy Floro., NP on 12/17/17 1121  AP.3 - Daisy Floro., NP on 12/15/2017 1445  AP.4 - Daisy Floro., NP on 12/23/2017 1451

## 2017-12-26 DEATH — deceased
